# Patient Record
Sex: Male | Born: 1990
Health system: Southern US, Community
[De-identification: ages and names within clinical notes are randomized; demographics above are authoritative.]

## PROBLEM LIST (undated history)

## (undated) ENCOUNTER — Emergency Department (HOSPITAL_COMMUNITY): Admission: EM | Discharge status: Against Medical Advice | Payer: Self-pay

## (undated) DIAGNOSIS — I10 Essential (primary) hypertension: Secondary | ICD-10-CM

## (undated) DIAGNOSIS — K219 Gastro-esophageal reflux disease without esophagitis: Secondary | ICD-10-CM

## (undated) HISTORY — PX: OTHER SURGICAL HISTORY: SHX169

---

## 2010-05-27 ENCOUNTER — Emergency Department (HOSPITAL_COMMUNITY)
Admission: EM | Admit: 2010-05-27 | Discharge: 2010-05-27 | Disposition: A | Discharge status: Home / Self Care | Payer: Self-pay | Attending: Emergency Medicine | Admitting: Emergency Medicine

## 2010-05-27 DIAGNOSIS — L299 Pruritus, unspecified: Secondary | ICD-10-CM | POA: Insufficient documentation

## 2010-05-27 DIAGNOSIS — R109 Unspecified abdominal pain: Secondary | ICD-10-CM | POA: Insufficient documentation

## 2010-05-27 DIAGNOSIS — B356 Tinea cruris: Secondary | ICD-10-CM | POA: Insufficient documentation

## 2011-02-10 ENCOUNTER — Emergency Department (HOSPITAL_COMMUNITY)
Admission: EM | Admit: 2011-02-10 | Discharge: 2011-02-10 | Disposition: A | Discharge status: Home / Self Care | Payer: Self-pay | Attending: Emergency Medicine | Admitting: Emergency Medicine

## 2011-02-10 DIAGNOSIS — Z0389 Encounter for observation for other suspected diseases and conditions ruled out: Secondary | ICD-10-CM | POA: Insufficient documentation

## 2011-08-18 ENCOUNTER — Emergency Department (HOSPITAL_COMMUNITY)
Admission: EM | Admit: 2011-08-18 | Discharge: 2011-08-18 | Disposition: A | Discharge status: Hospice | Payer: Self-pay | Attending: Emergency Medicine | Admitting: Emergency Medicine

## 2011-08-18 ENCOUNTER — Encounter (HOSPITAL_COMMUNITY): Payer: Self-pay | Admitting: *Deleted

## 2011-08-18 DIAGNOSIS — M545 Low back pain, unspecified: Secondary | ICD-10-CM

## 2011-08-18 LAB — URINALYSIS, ROUTINE W REFLEX MICROSCOPIC
Glucose, UA: NEGATIVE mg/dL
Leukocytes, UA: NEGATIVE
Nitrite: NEGATIVE
Specific Gravity, Urine: 1.016 (ref 1.005–1.030)
pH: 7 (ref 5.0–8.0)

## 2011-08-18 MED ORDER — DIAZEPAM 5 MG PO TABS: 5.0000 mg | ORAL_TABLET | Freq: Two times a day (BID) | ORAL | Status: AC

## 2011-08-18 NOTE — ED Provider Notes (Signed)
History     CSN: [id number]  Arrival date & time 08/18/11  7829   First MD Initiated Contact with Patient 08/18/11 1037      Chief Complaint  Patient presents with  . Flank Pain    (Consider location/radiation/quality/duration/timing/severity/associated sxs/prior treatment) HPI Comments: Patient reports that he has had intermittent right sided lower back pain for the past couple of months.  He describes the pain as a tightness.  The pain does not radiate.  He reports that the pain is worse with movement of the back.  He boxes for a living.  He has not tried taking anything for his symptoms.  Patient is a 21 y.o. male presenting with back pain. The history is provided by the patient.  Back Pain  This is a recurrent problem. The pain is associated with no known injury. The symptoms are aggravated by bending and twisting. Pertinent negatives include no chest pain, no fever, no numbness, no bowel incontinence, no perianal numbness, no bladder incontinence, no dysuria, no paresthesias, no paresis, no tingling and no weakness. He has tried nothing for the symptoms.    History reviewed. No pertinent past medical history.  History reviewed. No pertinent past surgical history.  History reviewed. No pertinent family history.  History  Substance Use Topics  . Smoking status: Never Smoker   . Smokeless tobacco: Not on file  . Alcohol Use: Yes      Review of Systems  Constitutional: Negative for fever.  HENT: Negative for neck stiffness.   Respiratory: Negative for shortness of breath.   Cardiovascular: Negative for chest pain.  Gastrointestinal: Negative for bowel incontinence.  Genitourinary: Negative for bladder incontinence, dysuria, hematuria, discharge, difficulty urinating, penile pain and testicular pain.  Musculoskeletal: Positive for back pain. Negative for gait problem.  Neurological: Negative for tingling, weakness, numbness and paresthesias.    Allergies   Ibuprofen  Home Medications   Current Outpatient Rx  Name Route Sig Dispense Refill  . DIAZEPAM 5 MG PO TABS Oral Take 1 tablet (5 mg total) by mouth 2 (two) times daily. 10 tablet 0    BP 127/97  Pulse 76  Temp 98 F (36.7 C)  Resp 18  SpO2 99%  Physical Exam  Nursing note and vitals reviewed. Constitutional: He appears well-developed and well-nourished. No distress.  HENT:  Head: Normocephalic and atraumatic.  Mouth/Throat: Oropharynx is clear and moist.  Neck: Normal range of motion. Neck supple.  Cardiovascular: Normal rate, regular rhythm and normal heart sounds.   Pulmonary/Chest: Effort normal and breath sounds normal.  Abdominal: Soft. Normal appearance and bowel sounds are normal. He exhibits no mass. There is no tenderness. There is no rigidity, no guarding and no CVA tenderness.  Musculoskeletal:       Cervical back: He exhibits no tenderness, no bony tenderness, no swelling, no edema and no deformity.       Thoracic back: He exhibits normal range of motion, no tenderness, no bony tenderness, no swelling, no edema and no deformity.       Lumbar back: He exhibits normal range of motion, no tenderness, no bony tenderness, no swelling, no edema and no deformity.       Increased pain with twisting and lateral bending of the back.  Neurological: He is alert.  Skin: Skin is warm and dry. He is not diaphoretic. No erythema.  Psychiatric: He has a normal mood and affect.    ED Course  Procedures (including critical care time)   Labs Reviewed  URINALYSIS, ROUTINE W REFLEX MICROSCOPIC   No results found.   1. Lower back pain       MDM  Patient with back pain.  No neurological deficits and normal neuro exam.  Patient able to ambulate.  Pain worse with movement and patient boxes for a living.  Suspect that pain is musculoskeletal.   No loss of bowel or bladder control.  No concern for cauda equina.  No fever, night sweats, weight loss, h/o cancer, IVDU.  RICE  protocol and pain medicine indicated and discussed with patient.  UA negative.  Therefore, feel that kidney stone is unlikely.          Pascal Lux [place], PA-C 08/18/11 1739

## 2011-08-18 NOTE — Discharge Instructions (Signed)
Back Exercises   Back exercises help treat and prevent back injuries. The goal of back exercises is to increase the strength of your abdominal and back muscles and the flexibility of your back. These exercises should be started when you no longer have back pain. Back exercises include:   Pelvic Tilt. Lie on your back with your knees bent. Tilt your pelvis until the lower part of your back is against the floor. Hold this position 5 to 10 sec and repeat 5 to 10 times.   Knee to Chest. Pull first 1 knee up against your chest and hold for 20 to 30 seconds, repeat this with the other knee, and then both knees. This may be done with the other leg straight or bent, whichever feels better.   Sit-Ups or Curl-Ups. Bend your knees 90 degrees. Start with tilting your pelvis, and do a partial, slow sit-up, lifting your trunk only 30 to 45 degrees off the floor. Take at least 2 to 3 seconds for each sit-up. Do not do sit-ups with your knees out straight. If partial sit-ups are difficult, simply do the above but with only tightening your abdominal muscles and holding it as directed.   Hip-Lift. Lie on your back with your knees flexed 90 degrees. Push down with your feet and shoulders as you raise your hips a couple inches off the floor; hold for 10 seconds, repeat 5 to 10 times.   Back arches. Lie on your stomach, propping yourself up on bent elbows. Slowly press on your hands, causing an arch in your low back. Repeat 3 to 5 times. Any initial stiffness and discomfort should lessen with repetition over time.   Shoulder-Lifts. Lie face down with arms beside your body. Keep hips and torso pressed to floor as you slowly lift your head and shoulders off the floor.   Do not overdo your exercises, especially in the beginning. Exercises may cause you some mild back discomfort which lasts for a few minutes; however, if the pain is more severe, or lasts for more than 15 minutes, do not continue exercises until you see your caregiver.  Improvement with exercise therapy for back problems is slow.   See your caregivers for assistance with developing a proper back exercise program.   Document Released: 05/19/2004 Document Revised: 03/31/2011 Document Reviewed: 04/11/2005   ExitCare® Patient Information ©2012 [employer].     Back Pain, Adult   Low back pain is very common. About 1 in 5 people have back pain. The cause of low back pain is rarely dangerous. The pain often gets better over time. About half of people with a sudden onset of back pain feel better in just 2 weeks. About 8 in 10 people feel better by 6 weeks.   CAUSES   Some common causes of back pain include:   Strain of the muscles or ligaments supporting the spine.   Wear and tear (degeneration) of the spinal discs.   Arthritis.   Direct injury to the back.   DIAGNOSIS   Most of the time, the direct cause of low back pain is not known. However, back pain can be treated effectively even when the exact cause of the pain is unknown. Answering your caregiver's questions about your overall health and symptoms is one of the most accurate ways to make sure the cause of your pain is not dangerous. If your caregiver needs more information, he or she may order lab work or imaging tests (X-rays or MRIs). However, even   if imaging tests show changes in your back, this usually does not require surgery.   HOME CARE INSTRUCTIONS   For many people, back pain returns. Since low back pain is rarely dangerous, it is often a condition that people can learn to manage on their own.   Remain active. It is stressful on the back to sit or stand in one place. Do not sit, drive, or stand in one place for more than 30 minutes at a time. Take short walks on level surfaces as soon as pain allows. Try to increase the length of time you walk each day.   Do not stay in bed. Resting more than 1 or 2 days can delay your recovery.   Do not avoid exercise or work. Your body is made to move. It is not dangerous to be active,  even though your back may hurt. Your back will likely heal faster if you return to being active before your pain is gone.   Pay attention to your body when you bend and lift. Many people have less discomfort when lifting if they bend their knees, keep the load close to their bodies, and avoid twisting. Often, the most comfortable positions are those that put less stress on your recovering back.   Find a comfortable position to sleep. Use a firm mattress and lie on your side with your knees slightly bent. If you lie on your back, put a pillow under your knees.   Only take over-the-counter or prescription medicines as directed by your caregiver. Over-the-counter medicines to reduce pain and inflammation are often the most helpful. Your caregiver may prescribe muscle relaxant drugs. These medicines help dull your pain so you can more quickly return to your normal activities and healthy exercise.   Put ice on the injured area.   Put ice in a plastic bag.   Place a towel between your skin and the bag.   Leave the ice on for 15 to 20 minutes, 3 to 4 times a day for the first 2 to 3 days. After that, ice and heat may be alternated to reduce pain and spasms.   Ask your caregiver about trying back exercises and gentle massage. This may be of some benefit.   Avoid feeling anxious or stressed. Stress increases muscle tension and can worsen back pain. It is important to recognize when you are anxious or stressed and learn ways to manage it. Exercise is a great option.   SEEK MEDICAL CARE IF:   You have pain that is not relieved with rest or medicine.   You have pain that does not improve in 1 week.   You have new symptoms.   You are generally not feeling well.   SEEK IMMEDIATE MEDICAL CARE IF:   You have pain that radiates from your back into your legs.   You develop new bowel or bladder control problems.   You have unusual weakness or numbness in your arms or legs.   You develop nausea or vomiting.   You develop abdominal  pain.   You feel faint.   Document Released: 04/11/2005 Document Revised: 03/31/2011 Document Reviewed: 08/30/2010   ExitCare® Patient Information ©2012 [employer].

## 2011-08-18 NOTE — ED Notes (Signed)
Pt reports (L) flank pain x several months.  Denies urinary symptoms.  LBM-yesterday-normal.

## 2011-08-20 NOTE — ED Provider Notes (Signed)
Evaluation and management procedures were performed by the PA/NP/resident physician under my supervision/collaboration.   Joelynn Dust D Gerrett Loman, MD 08/20/11 2054 

## 2013-09-03 ENCOUNTER — Encounter (HOSPITAL_COMMUNITY): Payer: Self-pay | Admitting: Emergency Medicine

## 2013-09-03 ENCOUNTER — Emergency Department (INDEPENDENT_AMBULATORY_CARE_PROVIDER_SITE_OTHER): Payer: Self-pay

## 2013-09-03 ENCOUNTER — Emergency Department (INDEPENDENT_AMBULATORY_CARE_PROVIDER_SITE_OTHER)
Admission: EM | Admit: 2013-09-03 | Discharge: 2013-09-03 | Disposition: A | Discharge status: Home / Self Care | Payer: Self-pay | Source: Home / Self Care | Attending: Emergency Medicine | Admitting: Emergency Medicine

## 2013-09-03 DIAGNOSIS — S43429A Sprain of unspecified rotator cuff capsule, initial encounter: Secondary | ICD-10-CM

## 2013-09-03 DIAGNOSIS — S46019A Strain of muscle(s) and tendon(s) of the rotator cuff of unspecified shoulder, initial encounter: Secondary | ICD-10-CM

## 2013-09-03 MED ORDER — MELOXICAM 15 MG PO TABS: 15.0000 mg | ORAL_TABLET | Freq: Every day | ORAL | Status: DC

## 2013-09-03 NOTE — ED Provider Notes (Signed)
  Chief Complaint   Chief Complaint  Patient presents with  . 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 He was throwing a punch and felt something pull in his posterior shoulder. Ever since then he's had pain which is most evident in a certain boxing blows or if he externally rotates the arm. He is able to abduct fairly well and flexion doesn't cause him any problems. He denies any numbness or tingling down the arm, muscle weakness, or swelling of the arm.  Review of Systems   Other than as noted above, the patient denies any of the following symptoms: Systemic:  No fevers or chills. Musculoskeletal:  No joint pain, arthritis, swelling, back pain, or neck pain. No history of arthritis.  Neurological:  No muscular weakness or paresthesia.  PMFSH   Past medical history, family history, social history, meds, and allergies were reviewed.    Physical Examination     Vital signs:  BP 112/80  Pulse 72  Temp(Src) 98.7 F (37.1 C) (Oral)  Resp 10  SpO2 100% Gen:  Alert and oriented times 3.  In no distress. Musculoskeletal: There is no swelling and almost no pain to palpation. He does complain of a little bit of pain with palpation over the supraspinatus. Neer test was negative.  Hawkins test was positive.  Empty cans test was positive. Otherwise, all joints had a full a ROM with no swelling, bruising or deformity.  No edema, pulses full. Extremities were warm and pink.  Capillary refill was brisk.  Skin:  Clear, warm and dry.  No rash. Neuro:  Alert and oriented times 3.  Muscle strength was normal.  Sensation was intact to light touch.   Radiology   Dg Shoulder Right  09/03/2013   CLINICAL DATA:  Right shoulder pain, boxing injury  EXAM: RIGHT SHOULDER - 2+ VIEW  COMPARISON:  None.  FINDINGS: No fracture or dislocation is seen.  The joint spaces are preserved.  The  visualized soft tissues are unremarkable.  Visualized right lung is clear.  IMPRESSION: No fracture or dislocation is seen.   Electronically Signed   By: Charline BillsSriyesh  Krishnan M.D.   On: 09/03/2013 19:40   I reviewed the images independently and personally and concur with the radiologist's findings.  Assessment   The encounter diagnosis was Rotator cuff strain.  No evidence of rotator cuff tear.  Plan     1.  Meds:  The following meds were prescribed:   Discharge Medication List as of 09/03/2013  7:54 PM    START taking these medications   Details  meloxicam (MOBIC) 15 MG tablet Take 1 tablet (15 mg total) by mouth daily., Starting 09/03/2013, Until Discontinued, Normal        2.  Patient Education/Counseling:  The patient was given appropriate handouts, self care instructions, and instructed in symptomatic relief.  Given shoulder rehabilitation exercises, suggest he followup with sports medicine.  3.  Follow up:  The patient was told to follow up here if no better in 3 to 4 days, or sooner if becoming worse in any way, and given some red flag symptoms such as worsening pain or new neurological symptoms which would prompt immediate return.       Thomas Likesavid C Aleksandr Pellow, MD 09/03/13 2156

## 2013-09-03 NOTE — ED Notes (Signed)
Pain right shoulder w limited ROM same. No known injury

## 2013-09-03 NOTE — Discharge Instructions (Signed)

## 2013-10-14 ENCOUNTER — Ambulatory Visit (INDEPENDENT_AMBULATORY_CARE_PROVIDER_SITE_OTHER): Payer: Self-pay | Admitting: Family Medicine

## 2013-10-14 ENCOUNTER — Encounter: Payer: Self-pay | Admitting: Family Medicine

## 2013-10-14 VITALS — BP 118/83 | Ht 63.0 in | Wt 130.0 lb

## 2013-10-14 DIAGNOSIS — M67919 Unspecified disorder of synovium and tendon, unspecified shoulder: Secondary | ICD-10-CM

## 2013-10-14 DIAGNOSIS — M75101 Unspecified rotator cuff tear or rupture of right shoulder, not specified as traumatic: Secondary | ICD-10-CM

## 2013-10-14 DIAGNOSIS — M719 Bursopathy, unspecified: Secondary | ICD-10-CM

## 2013-10-14 MED ORDER — METHYLPREDNISOLONE ACETATE 40 MG/ML IJ SUSP
40.0000 mg | Freq: Once | INTRAMUSCULAR | Status: AC
  Administered 2013-10-14: 40 mg via INTRA_ARTICULAR

## 2013-10-14 NOTE — Patient Instructions (Signed)
We suspect subacromial bursitis. If it is not better in 4 weeks, please return to see us.

## 2013-10-16 DIAGNOSIS — M67921 Unspecified disorder of synovium and tendon, right upper arm: Secondary | ICD-10-CM | POA: Insufficient documentation

## 2013-10-16 NOTE — Assessment & Plan Note (Signed)
That he has primarily a supraspinatus imbalance with his internal/external rotators. We placed him on home exercise program for internal/external rotation only. We did give him a corticosteroid injection today and precautions to do no heavy lifting or heavy workouts for 24-48 hours after. Followup 4-6 weeks the

## 2013-10-16 NOTE — Progress Notes (Signed)
Patient ID: Thomas Moss, male   DOB: 02-Oct-1990, 23 y.o.   MRN: [id number]  Thomas Moss - 23 y.o. male MRN [id number]  Date of birth: 02-Oct-1990    SUBJECTIVE:     Right shoulder pain that started about 45 weeks ago. See us in onset. He is training as a Writerboxer. He had some pain before his last bout and then had increased pain afterwards but no specific injury. He is continued training through the pain. Pain is aching in nature, anterior and deep in the shoulder joint. No radiation to the hand. No weakness. ROS:     No fever, sweats, chills, unusual weight change.  PERTINENT  PMH / PSH FH / / SH:  Past Medical, Surgical, Social, and Family History Reviewed & Updated per EMR.  Pertinent Historical Findings include:  No prior history of right shoulder injury or surgery  OBJECTIVE: BP 118/83  Ht 5\' 3"  (1.6 m)  Wt 130 lb (58.968 kg)  BMI 23.03 kg/m2  Physical Exam:  Vital signs are reviewed. GENERAL: Well-developed male no acute distress Shoulder: Right. Full range of motion. Some pain with supraspinatus testing but strength is intact. Rotator cuff strength is intact in all planes. Biceps tendon is nontender.  INJECTION: Patient was given informed consent, signed copy in the chart. Appropriate time out was taken. Area prepped and draped in usual sterile fashion. 1 cc of methylprednisolone 40 mg/ml plus  4 cc of 1% lidocaine without epinephrine was injected into the right subacromial bursa using a(n) posterior approach. The patient tolerated the procedure well. There were no complications. Post procedure instructions were given.   ASSESSMENT & PLAN:  See problem based charting & AVS for pt instructions. 2

## 2013-10-28 ENCOUNTER — Ambulatory Visit: Payer: Self-pay

## 2013-11-08 ENCOUNTER — Ambulatory Visit: Payer: Self-pay | Admitting: Family Medicine

## 2013-12-12 ENCOUNTER — Ambulatory Visit (INDEPENDENT_AMBULATORY_CARE_PROVIDER_SITE_OTHER): Payer: Self-pay | Admitting: Sports Medicine

## 2013-12-12 ENCOUNTER — Encounter: Payer: Self-pay | Admitting: Sports Medicine

## 2013-12-12 VITALS — BP 123/69 | HR 71 | Ht 63.0 in | Wt 135.0 lb

## 2013-12-12 DIAGNOSIS — M6790 Unspecified disorder of synovium and tendon, unspecified site: Secondary | ICD-10-CM

## 2013-12-12 DIAGNOSIS — M719 Bursopathy, unspecified: Secondary | ICD-10-CM

## 2013-12-12 DIAGNOSIS — M67921 Unspecified disorder of synovium and tendon, right upper arm: Secondary | ICD-10-CM

## 2013-12-12 MED ORDER — NITROGLYCERIN 0.2 MG/HR TD PT24: 0.2000 mg | MEDICATED_PATCH | Freq: Every day | TRANSDERMAL | Status: DC

## 2013-12-12 NOTE — Patient Instructions (Signed)
Lawnmower, Robbery exercises, shoulder rolls. & Biceps curls, and SUPINATION Work your way up to 3 sets of 15. Increasing weight.  Nitroglycerin Protocol  Apply 1/4 nitroglycerin patch to affected area daily.  Change position of patch within the affected area every 24 hours.  You may experience a headache during the first 1-2 weeks of using the patch, these should subside.  If you experience headaches after beginning nitroglycerin patch treatment, you may take your preferred over the counter pain reliever.  Another side effect of the nitroglycerin patch is skin irritation or rash related to patch adhesive.  Please notify our office if you develop more severe headaches or rash, and stop the patch.  Tendon healing with nitroglycerin patch may require 12 to 24 weeks depending on the extent of injury.  Men should not use if taking Viagra, Cialis, or Levitra.   Do not use if you have migraines or rosacea.

## 2013-12-12 NOTE — Progress Notes (Signed)
  Thomas Moss - 23 y.o. male MRN [id number]  Date of birth: 22-Jan-1991  SUBJECTIVE:  Including CC & ROS.  The patient is following up for evaluation of: Right Shoulder Pain: Persistent anterior shoulder pain; 50% improved following injection but seems to be worsening.  Left shoulder is beginning to have some  similar symptoms but only minimally at this time.  Pain with any upward motion.  Significant activity modification including essentially stopping lifting weight; doing some pushups and pullups; worse pain with pullups.  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   HISTORY: Past Medical, Surgical, Social, and Family History Reviewed & Updated per EMR. Pertinent Historical Findings include: Underwent CSI 10/14/2013.  Otherwise healthy  DATA REVIEWED:  X-ray 09/03/13: Well maintained joint space; overall normal shoulder  PHYSICAL EXAM:  VS: BP:123/69 mmHg  HR:71bpm  TEMP: ( )  RESP:   HT:5\' 3"  (160 cm)   WT:135 lb (61.236 kg)  BMI:24 PHYSICAL EXAM: GENERAL:  Adult well built [place]  male. In no discomfort; no respiratory distress  PSYCH:  alert and appropriate, good insight  Shoulder Exam:        Gen/Palp:  Bilaterally protracted shoulders with significant, symmetric muscle hypertrophy.  Significant scapular dyskinesia  From 60-120 degrees on right; less on left      ROM/MST:  Limitation in end range shoulder flexion; internal rotation on R to t10; left to t6.   Internal/external rotation strength 5/5 but right slightly weaker than left in internal rotation                      NV:  Sensation intact, myotomal strength 5+/5. Negative spurlings. Radial pulses 2+/4                 Tests:  Negative empty can, hawkins, yergason's, obriens.  Positive Speeds and cross arm test. Limited MSK Ultrasound Exam Of Shoulder:           Findings:   Large Hypoechoic change around long head of biceps with apparent partial tendon disruption.  Minimal supraspinatus and  subscapularis hypoechoic change.  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

## 2013-12-13 NOTE — Assessment & Plan Note (Addendum)
Acute on chronic condition  - Exam and clinic symptoms consistent with Biceps.  No significant evidence of impingement. 1. Nitroglycerin protocol 2. Strengthening exercises (for both shoulder as suspect early development on left) per AVS/ periscap mm > Return in 4 weeks and plan on reultrasound; ensure left arm symptoms improving with conservative care.

## 2014-01-09 ENCOUNTER — Ambulatory Visit: Payer: Self-pay | Admitting: Sports Medicine

## 2015-09-04 ENCOUNTER — Encounter: Payer: Self-pay | Admitting: Family Medicine

## 2015-09-04 ENCOUNTER — Ambulatory Visit (INDEPENDENT_AMBULATORY_CARE_PROVIDER_SITE_OTHER): Payer: Self-pay | Admitting: Family Medicine

## 2015-09-04 VITALS — BP 121/75 | Ht 63.0 in | Wt 132.0 lb

## 2015-09-04 DIAGNOSIS — M7522 Bicipital tendinitis, left shoulder: Secondary | ICD-10-CM

## 2015-09-04 MED ORDER — NITROGLYCERIN 0.2 MG/HR TD PT24: MEDICATED_PATCH | TRANSDERMAL | Status: DC

## 2015-09-04 NOTE — Patient Instructions (Signed)
Mild bicep tendinitis Decrease strength of jabs tp 75% for next 3 weeks DAILY do 30 bicep curls with low weigjht---NOT HEAVY weight AFTER your workout, place 1/4 of nitroglycerin patch on bicep area and wear it until 1 1/2 hour befpre next work out--take it off then and leave off until you put a new one on AFTER your workout. See me in 3-4 weeks  You may experience a headache during the first 1-2 days and maybe up to 2 weeks of using the patch; these should improve and go away. If you experience headaches after beginning nitroglycerin patch treatment, you may take your preferred over the counter pain medicine such as tylenol or advil as directed on the box. Another side effect of the nitroglycerin patch can be skin irritation  or rash related to the patch adhesive.   Please notify our office if you develop  severe headaches or rash, and stop the patch.  Please call our office with any questions or problems. Tendon healing with nitroglycerin patch may require 12 to 24 weeks depending on the extent of injury. Men should not use while  taking Viagra, Cialis, or Levitra as it may cause an unsafe lowering of the blood pressure.. Take off at least an hour before use of any of these meds and replace after intercourse. Use with caution if you have migraines or rosacea.

## 2015-09-07 ENCOUNTER — Other Ambulatory Visit: Payer: Self-pay | Admitting: *Deleted

## 2015-09-07 DIAGNOSIS — M7522 Bicipital tendinitis, left shoulder: Secondary | ICD-10-CM | POA: Insufficient documentation

## 2015-09-07 DIAGNOSIS — M7521 Bicipital tendinitis, right shoulder: Secondary | ICD-10-CM | POA: Insufficient documentation

## 2015-09-07 MED ORDER — NITROGLYCERIN 0.2 MG/HR TD PT24: MEDICATED_PATCH | TRANSDERMAL | Status: DC

## 2015-09-07 MED FILL — NITROGLYCERIN 0.2 MG/HR PTC: 0.2 | 28 days supply | Qty: 4 | Fill #0
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
## 2015-09-07 NOTE — Progress Notes (Signed)
Patient ID: Thomas Moss, male   DOB: 05-11-1990, 25 y.o.   MRN: [id number]  Thomas Moss - [age] y.o. 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 This is causing him to alter his training and he is not able to fight as his maximum capacity. He's had no specific injury. He does not think he significantly changed his training regimen.Pain is present during certain activities and occasionally at night but not present all day ROS:     No unusual weight change, fever, sweats, chills. He's noted no numbness or tingling in his upper extremities.  PERTINENT  PMH / PSH FH / / SH:  Past Medical, Surgical, Social, and Family History Reviewed & Updated in the EMR.  Pertinent findings include:  Nonsmoker, no personal history diabetes mellitus. No prior history of specific shoulder injury. No history of left shoulder surgery. He has had some rotator cuff syndrome problems of the right shoulder back in 2015.  OBJECTIVE: BP 121/75 mmHg  Ht 5\' 3"  (1.6 m)  Wt 132 lb (59.875 kg)  BMI 23.39 kg/m2  Physical Exam:  Vital signs are reviewed. GEN.: Well-developed male no acute distress SHOULDERS: Bilaterally has full range of motion and intact strength in all planes the rotator cuff. Mild tenderness to palpation of the left biceps tendon rock similarly. Intact biceps strength. Is no defect noted in the bicep. Vascular: Radial pulses 2+ bilaterally equal. SKIN: Is no erythema, no increased warmth, no rash noted around the area of the left upper arm bicep area.  ULTRASOUND: He is a small amount of fluid in the bicep as it enters the bicipital groove. The tendon is intact.  ASSESSMENT & PLAN:  See problem based charting & AVS for pt instructions.

## 2015-10-09 ENCOUNTER — Encounter: Payer: Self-pay | Admitting: Family Medicine

## 2015-10-09 ENCOUNTER — Ambulatory Visit (INDEPENDENT_AMBULATORY_CARE_PROVIDER_SITE_OTHER): Payer: Self-pay | Admitting: Family Medicine

## 2015-10-09 VITALS — BP 116/74 | Ht 63.0 in | Wt 132.0 lb

## 2015-10-09 DIAGNOSIS — M67929 Unspecified disorder of synovium and tendon, unspecified upper arm: Secondary | ICD-10-CM

## 2015-10-09 MED ORDER — NITROGLYCERIN 0.2 MG/HR TD PT24: MEDICATED_PATCH | TRANSDERMAL | Status: DC

## 2015-10-09 MED FILL — NITROGLYCERIN 0.2 MG/HR PTC: 0.2 | 28 days supply | Qty: 4 | Fill #1

## 2015-10-09 NOTE — Progress Notes (Signed)
Patient ID: Thomas Moss, male   DOB: Apr 20, 1991, 25 y.o.   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  Pt states that since being seen last month L shoulder pain has stayed about the same.  Pain is in front of L shoulder (points to biceps tendon), worse with training/certain motions, usually not painful at rest.  Has primarily been using nitroglycerin patches, however has used whole patch (was supposed to cut into fourths) and intermittently 2/2 headaches (was supposed to use continuously).  Has not been taking other medications.  Still weight lifting and training, has fight July 1st and wants advise for pain management.  Very similar but more mild pain has appeared in the R shoulder since last visit, not treating it.    SHx: boxer, next fight 10/24/2015  ROS: Pertinent review of systems: negative for fever or unusual weight change.    PE: BP 116/74 mmHg  Ht 5\' 3"  (1.6 m)  Wt 132 lb (59.875 kg)  BMI 23.39 kg/m2 Gen: 25 y.o. yo male sitting one exam table in NAD HEENT: normocephalic, atraumatic Pulm: normal work of breathing MSK:  RUE: inspection reveals no abnormalities, - TTP throughout, full active ROM, strength 5/5 throughout, sensation grossly intact, - empty can, cross arm test, non-painful arch LUE: inspection reveals no abnormalities, + TTP over biceps tendon at bicipital groove, full active ROM, strength and sensation intact throughout, - empty can, cross arm test, non-painful arch   A/P: Thomas Moss is a 25 y.o. yo male with biceps tendinitis not improving.  L biceps tendinitis Pain not well controlled, was not using nitroglycerin patches correctly. -- Advised to refill nitroglycerin patches today -- Use 1/4 patch continuously -- Counseled that may have headache first 48 hours, but best thing to do is persist and it will eventually go  away -- Advised to not use patch day leading up to fight -- Still can do full training and fight 7/1 -- F/u after fight to further eval pain  Follow-up: 2 weeks (after 7/1)  Thomas Moss, MS4

## 2015-10-30 ENCOUNTER — Ambulatory Visit: Payer: Self-pay | Admitting: Family Medicine

## 2015-11-19 MED FILL — NITROGLYCERIN 0.2 MG/HR PTC: 0.2 | 28 days supply | Qty: 4 | Fill #2

## 2016-06-20 MED FILL — NITROGLYCERIN 0.2 MG/HR PTC: 0.2 | 28 days supply | Qty: 4 | Fill #3

## 2016-10-07 ENCOUNTER — Ambulatory Visit: Payer: Self-pay | Admitting: Family Medicine

## 2017-03-13 ENCOUNTER — Other Ambulatory Visit: Payer: Self-pay | Admitting: *Deleted

## 2017-03-13 MED ORDER — NITROGLYCERIN 0.2 MG/HR TD PT24
MEDICATED_PATCH | TRANSDERMAL | 0 refills | Status: DC
Start: 2017-03-13 — End: 2017-07-07

## 2017-03-28 MED FILL — NITROGLYCERIN 0.2 MG/HR PTC: 0.2 | 30 days supply | Qty: 8 | Fill #0

## 2017-05-29 ENCOUNTER — Ambulatory Visit: Payer: Self-pay

## 2017-07-07 ENCOUNTER — Ambulatory Visit (INDEPENDENT_AMBULATORY_CARE_PROVIDER_SITE_OTHER): Payer: Self-pay | Admitting: Family Medicine

## 2017-07-07 ENCOUNTER — Encounter: Payer: Self-pay | Admitting: Family Medicine

## 2017-07-07 DIAGNOSIS — S46811A Strain of other muscles, fascia and tendons at shoulder and upper arm level, right arm, initial encounter: Secondary | ICD-10-CM

## 2017-07-07 MED ORDER — NITROGLYCERIN 0.2 MG/HR TD PT24: MEDICATED_PATCH | TRANSDERMAL | 1 refills | Status: DC

## 2017-07-07 MED FILL — NITROGLYCERIN 0.2 MG/HR PTC: 0.2 | 30 days supply | Qty: 8 | Fill #0

## 2017-07-07 NOTE — Progress Notes (Signed)
HPI  CC: Right shoulder pain Patient is here with complaints of right-sided shoulder pain.  He states that pain began approximately 2 weeks ago.  Patient is a Patent examiner.  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  He denies any feelings of subluxation or dislocation.  Pain lessened over time but has persisted over the past 2 weeks.  He experiences most of his discomfort with "crosses".  No weakness, numbness, or paresthesias.  No swelling or bruising.  Pain is described as being deep, anterior, and somewhat posterior.  Medications/Interventions Tried: Ibuprofen  See HPI and/or previous note for associated ROS.  Objective: BP 120/86   Ht 5\' 3"  (1.6 m)   Wt 128 lb (58.1 kg)   BMI 22.67 kg/m  Gen: Right-Hand Dominant. NAD, well groomed, a/o x3, normal affect.  CV: Well-perfused. Warm.  Resp: Non-labored.  Neuro: Sensation intact throughout. No gross coordination deficits.  Gait: Nonpathologic posture, unremarkable stride without signs of limp or balance issues. Shoulder, Right: TTP noted at the bicipital groove and deep anterior shoulder. No evidence of bony deformity, asymmetry, or muscle atrophy; No TTP at [facility] joint. Full active and passive range of motion (180 flex Elisabeth Most /150Abd /90ER /70IR). Strength significantly reduced (3/5) in IR, otherwise 5/5 throughout. No abnormal scapular function observed. Sensation intact. Peripheral pulses intact.  Special Tests:   - Crossarm test: NEG   - Empty can: Mildly positive   - Hawkins: NEG   - Obrien's test: NEG   - Yergason's: NEG   - Speeds test: Positive  ULTRASOUND: Shoulder, right Diagnostic limited ultrasound imaging obtained of patient's right shoulder.  - No obvious evidence of bony deformity or osteophyte development appreciated.  - Long head of the biceps tendon: No evidence of tendon thickening, calcification, subluxation, or tearing in short  or long axis views. POSITIVE fluid presence with bullseye sign.  - Subscapularis tendon: complete visualization across the width of the insertion point yielded evidence of tendon rupture and fluid presence with enough tendinous involvement to be concern for full-thickness tear.  - Supraspinatus tendon: complete visualization across the width of the insertion point yielded no evidence of tendon thickening, calcification, or tears in the long axis view. No evidence of bursal inflammation appreciated.  - Infraspinatus and teres minor tendons: visualization across the width of the insertion points yielded no evidence of tendon thickening, calcification, or tears in the long axis view.  IMPRESSION: findings consistent with - Large partial, possible full-thickness subscapularis tendon tear.   - Bicipital tendinitis of the long head.   Assessment and plan:  Full thickness tear of right subscapularis tendon Patient is here with signs and symptoms concerning for full-thickness tear of the subscapularis tendon.  I had a long discussion with patient regarding this injury as he is a Patent examiner.  I informed him that in the case that it is a full-thickness tear surgical intervention may be his most appropriate choice to return to "100%" considering his profession.  Conservative treatment is not unreasonable however this option is usually better suited for non-professional athletes. -Patient encouraged to "shut down" the right shoulder for the next 4-5 days -Home exercises with Thera-Band provided today.  Patient is to begin in 4-5 days. -Nitroglycerin patches prescribed today. -Patient is to follow-up in 2-3 weeks (1 week prior to his next fight) to go over his progress.  Next: Patient was informed that if he decides that he desires  surgical intervention he is to contact our office.  At that time we will either order an MRI and refer him or simply refer him to surgery.   Meds ordered this encounter    Medications  . DISCONTD: nitroGLYCERIN (NITRODUR - DOSED IN MG/24 HR) 0.2 mg/hr patch    Sig: Place 1/4 patch to the affected area daily.    Dispense:  30 patch    Refill:  1  . nitroGLYCERIN (NITRODUR - DOSED IN MG/24 HR) 0.2 mg/hr patch    Sig: Place 1/4 patch to the affected area daily.    Dispense:  30 patch    Refill:  1     Kathee DeltonIan D McKeag, MD,MS [facility] Sports Medicine Fellow 07/07/2017 12:25 PM

## 2017-07-07 NOTE — Assessment & Plan Note (Signed)
Patient is here with signs and symptoms concerning for full-thickness tear of the subscapularis tendon.  I had a long discussion with patient regarding this injury as he is a Patent examinerprofessional boxer.  I informed him that in the case that it is a full-thickness tear surgical intervention may be his most appropriate choice to return to "100%" considering his profession.  Conservative treatment is not unreasonable however this option is usually better suited for non-professional athletes. -Patient encouraged to "shut down" the right shoulder for the next 4-5 days -Home exercises with Thera-Band provided today.  Patient is to begin in 4-5 days. -Nitroglycerin patches prescribed today. -Patient is to follow-up in 2-3 weeks (1 week prior to his next fight) to go over his progress.  Next: Patient was informed that if he decides that he desires surgical intervention he is to contact our office.  At that time we will either order an MRI and refer him or simply refer him to surgery.

## 2017-07-07 NOTE — Progress Notes (Signed)
[facility]: Attending Note: I have reviewed the chart, discussed wit the Sports Medicine Fellow. I agree with assessment and treatment plan as detailed in the Fellow's note. He has full-thickness subscapularis muscle tear.  Unclear if he will be able to complete his upcoming April.  He is already signed.  And back in 2-3 weeks to assess his improvement we may have to disqualify him for the match.

## 2017-07-07 NOTE — Patient Instructions (Signed)

## 2017-07-28 ENCOUNTER — Ambulatory Visit: Payer: Self-pay | Admitting: Family Medicine

## 2017-10-12 ENCOUNTER — Telehealth: Payer: Self-pay

## 2017-10-12 DIAGNOSIS — S46811A Strain of other muscles, fascia and tendons at shoulder and upper arm level, right arm, initial encounter: Secondary | ICD-10-CM

## 2017-10-12 NOTE — Telephone Encounter (Signed)
Referral placed to [facility]. 

## 2017-10-23 ENCOUNTER — Ambulatory Visit (INDEPENDENT_AMBULATORY_CARE_PROVIDER_SITE_OTHER): Payer: Self-pay | Admitting: Orthopaedic Surgery

## 2017-10-28 ENCOUNTER — Other Ambulatory Visit: Payer: Self-pay

## 2017-10-28 ENCOUNTER — Emergency Department (HOSPITAL_COMMUNITY)
Admission: EM | Admit: 2017-10-28 | Discharge: 2017-10-28 | Disposition: A | Discharge status: Home / Self Care | Payer: Self-pay | Attending: Emergency Medicine | Admitting: Emergency Medicine

## 2017-10-28 DIAGNOSIS — F1010 Alcohol abuse, uncomplicated: Secondary | ICD-10-CM

## 2017-10-28 DIAGNOSIS — F1019 Alcohol abuse with unspecified alcohol-induced disorder: Secondary | ICD-10-CM | POA: Insufficient documentation

## 2017-10-28 DIAGNOSIS — Z79899 Other long term (current) drug therapy: Secondary | ICD-10-CM | POA: Insufficient documentation

## 2017-10-28 NOTE — Discharge Instructions (Signed)
You are medically cleared and may follow-up with [facility] as you have planned. I will fax my medical note from today's visit to [facility] today. Well wishes in your journey!

## 2017-10-28 NOTE — ED Triage Notes (Signed)
Pt states he is here for medical clearance for alcoholism

## 2017-10-28 NOTE — ED Notes (Signed)
PT DISCHARGED. INSTRUCTIONS GIVEN. AAOX4. PT IN NO APPARENT DISTRESS OR PAIN. THE OPPORTUNITY TO ASK QUESTIONS WAS PROVIDED. 

## 2017-10-28 NOTE — ED Provider Notes (Addendum)
[facility]-EMERGENCY DEPT Provider Note  CSN: [id number] Arrival date & time: 10/28/17  1107   History   Chief Complaint Chief Complaint  Patient presents with  . Alcohol Problem    HPI Thomas Moss is a 27 y.o. male with no significant medical history who presented to the ED for alcohol detox. Patient states he was sent here from [facility] for "medical clearance from alcohol." Patient states his last drink was yesterday 10/27/17 around 10pm. He admits to drinking 3-4 beers or shots of liquor about 3 days a week. He states that on the days he does not drink that he does not experience any withdrawal symptoms or have physical complaints. Today patient has no physical complaints. Denies headache, tremor, abdominal pain, N/V/D, AMS/confusion or auditory/visual/tactile hallucinations.  No past medical history on file.  Patient Active Problem List   Diagnosis Date Noted  . Full thickness tear of right subscapularis tendon 07/07/2017  . Biceps tendinitis on left 09/07/2015  . Tendinopathy of right biceps tendon 10/16/2013    No past surgical history on file.      Home Medications    Prior to Admission medications   Medication Sig Start Date End Date Taking? Authorizing Provider  cyclobenzaprine (FLEXERIL) 5 MG tablet Take 5 mg by mouth at bedtime.    [provider]  ibuprofen (ADVIL,MOTRIN) 400 MG tablet Take 800 mg by mouth every 8 (eight) hours as needed.     [provider]  nitroGLYCERIN (NITRODUR - DOSED IN MG/24 HR) 0.2 mg/hr patch Place 1/4 patch to the affected area daily. 07/07/17   Nestor Ramp, MD    Family History No family history on file.  Social History Social History   Tobacco Use  . Smoking status: Never Smoker  Substance Use Topics  . Alcohol use: Yes  . Drug use: No     Allergies   Patient has no known allergies.   Review of Systems Review of Systems  Constitutional: Negative.   Eyes: Negative.     Respiratory: Negative.   Cardiovascular: Negative.   Gastrointestinal: Negative.   Endocrine: Negative.   Musculoskeletal: Negative.   Skin: Negative.   Neurological: Negative.   Psychiatric/Behavioral: Negative.      Physical Exam Updated Vital Signs BP (!) 141/97 (BP Location: Left Arm)   Pulse 66   Temp 98.4 F (36.9 C) (Oral)   Resp 18   SpO2 100%   Physical Exam  Constitutional: He is oriented to person, place, and time. He appears well-developed and well-nourished. He is cooperative. No distress.  HENT:  Head: Normocephalic and atraumatic.  Mouth/Throat: Uvula is midline, oropharynx is clear and moist and mucous membranes are normal.  Eyes: Pupils are equal, round, and reactive to light. Conjunctivae, EOM and lids are normal. No scleral icterus.  Cardiovascular: Normal rate, regular rhythm and normal heart sounds.  Pulmonary/Chest: Effort normal and breath sounds normal.  Abdominal: Soft. Bowel sounds are normal. He exhibits no distension. There is no hepatomegaly. There is no tenderness.  Neurological: He is alert and oriented to person, place, and time. He has normal strength. No cranial nerve deficit or sensory deficit. He exhibits normal muscle tone. He displays a negative Romberg sign. Coordination and gait normal.  Reflex Scores:      Tricep reflexes are 2+ on the right side and 2+ on the left side.      Bicep reflexes are 2+ on the right side and 2+ on the left side.  Brachioradialis reflexes are 2+ on the right side and 2+ on the left side.      Patellar reflexes are 2+ on the right side and 2+ on the left side.      Achilles reflexes are 2+ on the right side and 2+ on the left side. Skin: Skin is warm and intact. Capillary refill takes less than 2 seconds. He is not diaphoretic. No pallor.  Psychiatric: He has a normal mood and affect. His speech is normal and behavior is normal. Thought content normal. Cognition and memory are normal.  Nursing note and  vitals reviewed.    ED Treatments / Results  Labs (all labs ordered are listed, but only abnormal results are displayed) Labs Reviewed - No data to display  EKG None  Radiology No results found.  Procedures Procedures (including critical care time)  Medications Ordered in ED Medications - No data to display   Initial Impression / Assessment and Plan / ED Course  Triage vital signs and the nursing notes have been reviewed.  Pertinent labs & imaging results that were available during care of the patient were reviewed and considered in medical decision making (see chart for details).   Patient presents for alcohol detox and was informed that this ED does not perform detox. Physical exam is normal and patient has no focal neuro deficits or other findings that would warrant imaging or lab work today. He is able to converse and ambulate without issue. Thought process and speech is logical, linear and goal directed. Denies SI, HI and AVH. He appears to have metabolized completely.  Given patient's reported alcohol intake and frequency, it is very unlikely that he would score high enough on CIWA (>8) to necessitate benzodiazepines for withdrawal symptom control. He also reports not having any withdrawal symptoms on the days he is free from alcohol which is also positive. Patient already has a plan for treatment and [facility]  Education provided on withdrawal s/s and reasons to return to the ED. [facility] patient request.   Final Clinical Impressions(s) / ED Diagnoses   Dispo: Home. After thorough clinical evaluation, this patient is determined to be medically stable and can be safely discharged with the previously mentioned treatment and/or outpatient follow-up/referral(s). At this time, there are no other apparent medical conditions that require further screening, evaluation or treatment.   Final  diagnoses:  Alcohol use disorder, mild, abuse    ED Discharge Orders    None      Reva BoresMortis, Gabrielle I, PA-C 10/28/17 [address]1250    Mortis, CalienteGabrielle I, PA-C 10/28/17 1255    Maia PlanLong, Joshua G, MD 10/28/17 2020

## 2017-11-13 ENCOUNTER — Telehealth (INDEPENDENT_AMBULATORY_CARE_PROVIDER_SITE_OTHER): Payer: Self-pay | Admitting: Orthopaedic Surgery

## 2017-11-13 NOTE — Telephone Encounter (Signed)
Returned call to patient left message to call back. 

## 2017-11-15 ENCOUNTER — Other Ambulatory Visit (INDEPENDENT_AMBULATORY_CARE_PROVIDER_SITE_OTHER): Payer: Self-pay

## 2017-11-15 ENCOUNTER — Ambulatory Visit (INDEPENDENT_AMBULATORY_CARE_PROVIDER_SITE_OTHER): Payer: Self-pay | Admitting: Orthopaedic Surgery

## 2017-11-15 ENCOUNTER — Encounter (INDEPENDENT_AMBULATORY_CARE_PROVIDER_SITE_OTHER): Payer: Self-pay | Admitting: Orthopaedic Surgery

## 2017-11-15 DIAGNOSIS — G8929 Other chronic pain: Secondary | ICD-10-CM

## 2017-11-15 DIAGNOSIS — M25511 Pain in right shoulder: Principal | ICD-10-CM

## 2017-11-15 MED ORDER — LIDOCAINE HCL 1 % IJ SOLN
3.0000 mL | INTRAMUSCULAR | Status: AC | PRN
  Administered 2017-11-15: 3 mL

## 2017-11-15 MED ORDER — METHYLPREDNISOLONE ACETATE 40 MG/ML IJ SUSP
40.0000 mg | INTRAMUSCULAR | Status: AC | PRN
  Administered 2017-11-15: 40 mg via INTRA_ARTICULAR

## 2017-11-15 NOTE — Progress Notes (Signed)
Office Visit Note   Patient: Thomas Moss           Date of Birth: 19-Oct-1990           MRN: [id number] Visit Date: 11/15/2017              Requested by: Lavinia SharpsPlacey, Mary Ann, NP [address] [place], [place] [id number] PCP: Lavinia SharpsPlacey, Mary Ann, NP   Assessment & Plan: Visit Diagnoses:  1. Chronic right shoulder pain     Plan: His clinical exam shows excellent strength of the right shoulder.  This can be due to the fact he is a very muscular individual and is compensating for this well.  I did provide a steroid injection subacromial space today and this will help with inflammation.  I would actually like to obtain an MRI of his right shoulder to further assess the extent of his rotator cuff tear and retraction potentially since he has been having an issue with his shoulder for many years now.  We want to be able to see  the extent of any type of tear and potential retraction as well as damage to other tissues so we can best determine we will see him back once we have the MRI of his right shoulder.  What surgery may be warranted.  Follow-Up Instructions: Return in about 2 weeks (around 11/29/2017).   Orders:  Orders Placed This Encounter  Procedures  . Large Joint Inj   No orders of the defined types were placed in this encounter.     Procedures: Large Joint Inj: R subacromial bursa on 11/15/2017 2:09 PM Indications: pain and diagnostic evaluation Details: 22 G 1.5 in needle  Arthrogram: No  Medications: 3 mL lidocaine 1 %; 40 mg methylPREDNISolone acetate 40 MG/ML Outcome: tolerated well, no immediate complications Procedure, treatment alternatives, risks and benefits explained, specific risks discussed. Consent was given by the patient. Immediately prior to procedure a time out was called to verify the correct patient, procedure, equipment, support staff and site/side marked as required. Patient was prepped and draped in the usual sterile fashion.       Clinical Data: No  additional findings.   Subjective: Chief Complaint  Patient presents with  . Right Shoulder - Pain  The patient is referred today for evaluation treatment of a rotator cuff tear of his right shoulder.  This is apparently been diagnosed with an ultrasound earlier this year.  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  However he has been having shoulder issues for years now with the right shoulder.  This is been well documented in chart.  Is also an issue with alcohol more recently.  He says is more from the DUI and has had to get something straightened out for that.  He does report right shoulder pain and weakness.  He reports difficulty reaching behind with his right shoulder.  He is never had an injection of the shoulder either.  He is never had a shoulder dislocation.  HPI  Review of Systems He currently denies any headache, chest pain, shortness of breath, fever, chills, nausea, vomiting.  He says he is not drinking currently.  Objective: Vital Signs: There were no vitals taken for this visit.  Physical Exam He is alert and oriented x3 and in no acute distress Ortho Exam Examination of his right shoulder shows excellent strength with normal 5 out of 5 strength with abduction and external rotation.  His internal rotation with adduction is significant  limited.  He is negative liftoff.  His biceps tendon appears to be functioning appropriately as well.  The shoulder shows no atrophy in the muscle groups around the shoulder. Specialty Comments:  No specialty comments available.  Imaging: No results found. X-rays independently reviewed of the right shoulder show a normal-appearing shoulder.  The ultrasound report suggests a full-thickness rotator cuff tear of the supraspinatus tendon.  PMFS History: Patient Active Problem List   Diagnosis Date Noted  . Full thickness tear of right subscapularis tendon 07/07/2017  . Biceps tendinitis on left 09/07/2015  . Tendinopathy  of right biceps tendon 10/16/2013   History reviewed. No pertinent past medical history.  History reviewed. No pertinent family history.  History reviewed. No pertinent surgical history. Social History   Occupational History  . Not on file  Tobacco Use  . Smoking status: Never Smoker  Substance and Sexual Activity  . Alcohol use: Yes  . Drug use: No  . Sexual activity: Not on file

## 2017-11-29 ENCOUNTER — Ambulatory Visit (INDEPENDENT_AMBULATORY_CARE_PROVIDER_SITE_OTHER): Payer: Self-pay | Admitting: Physician Assistant

## 2017-12-06 ENCOUNTER — Ambulatory Visit (INDEPENDENT_AMBULATORY_CARE_PROVIDER_SITE_OTHER): Payer: Self-pay | Admitting: Orthopaedic Surgery

## 2018-03-26 ENCOUNTER — Other Ambulatory Visit: Payer: Self-pay | Admitting: Ophthalmology

## 2018-03-26 DIAGNOSIS — S069X0A Unspecified intracranial injury without loss of consciousness, initial encounter: Secondary | ICD-10-CM

## 2018-03-27 ENCOUNTER — Other Ambulatory Visit: Payer: Self-pay

## 2018-03-28 ENCOUNTER — Ambulatory Visit
Admission: RE | Admit: 2018-03-28 | Discharge: 2018-03-28 | Disposition: A | Discharge status: Home / Self Care | Payer: Self-pay | Source: Ambulatory Visit | Attending: Ophthalmology | Admitting: Ophthalmology

## 2018-03-28 DIAGNOSIS — S069X0A Unspecified intracranial injury without loss of consciousness, initial encounter: Secondary | ICD-10-CM

## 2020-06-14 IMAGING — CT CT HEAD W/O CM
3 of 4 series · 15 of 47 positions shown, 18 images · non-contrast
Comparison: None.

CLINICAL DATA: 27-year-old male undergoing clearance for
boxing/NYA.

EXAM:
CT HEAD WITHOUT CONTRAST
TECHNIQUE: Contiguous axial images were obtained from the base of the skull
through the vertex without intravenous contrast.

[Series 2: head 5.00 hr40 s3 ibhc · axial · 0.41mm/px · z∈[-632,-507]mm · 9 of 31 slices shown, 12 images]
[im 3/31  brain]
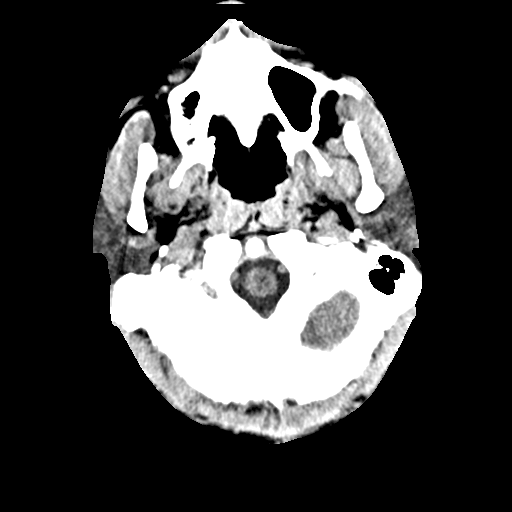
[im 3/31  bone]
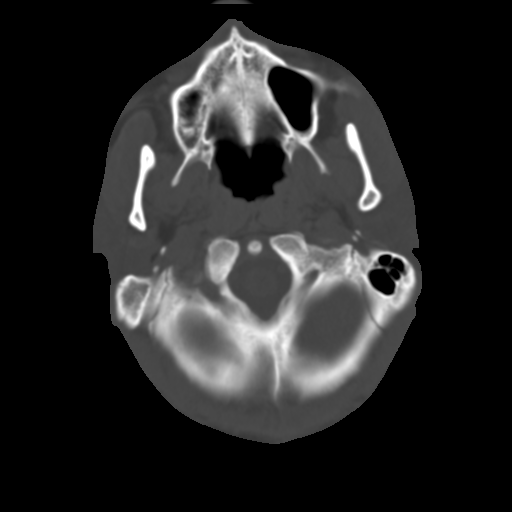
[im 7/31  brain]
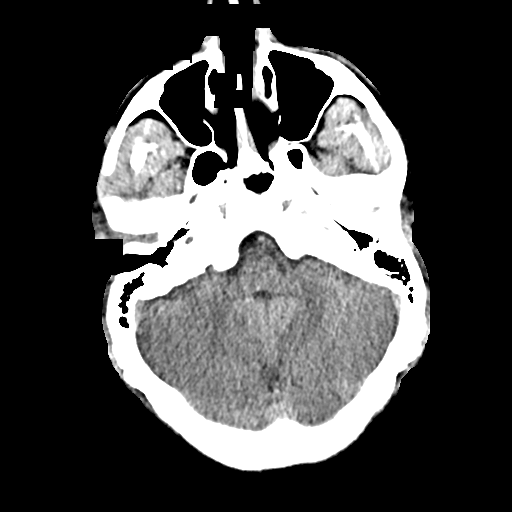
[im 9/31  brain]
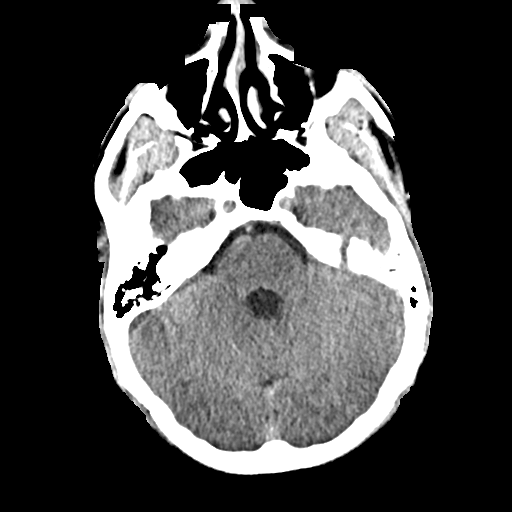
[im 13/31  brain]
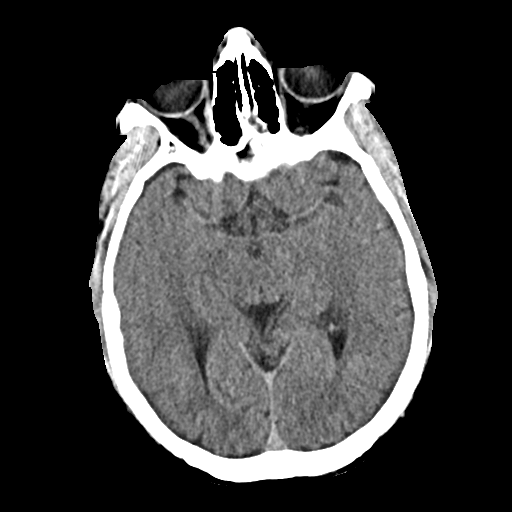
[im 16/31  brain]
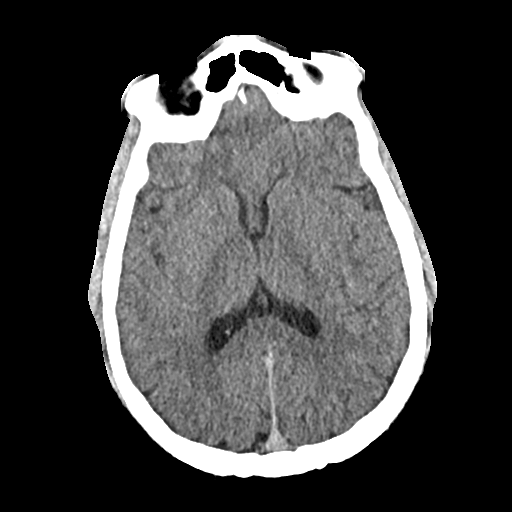
[im 16/31  bone]
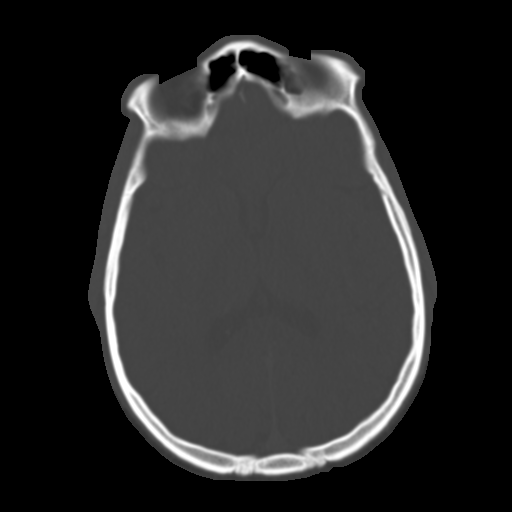
[im 18/31  brain]
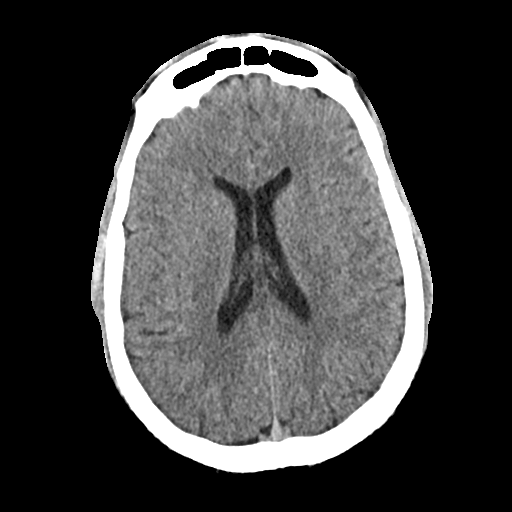
[im 22/31  brain]
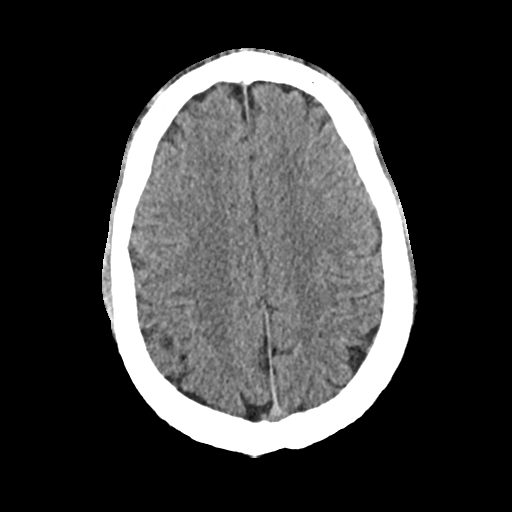
[im 24/31  brain]
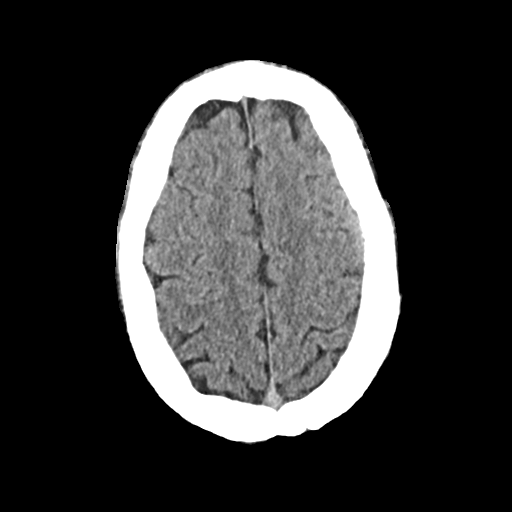
[im 28/31  brain]
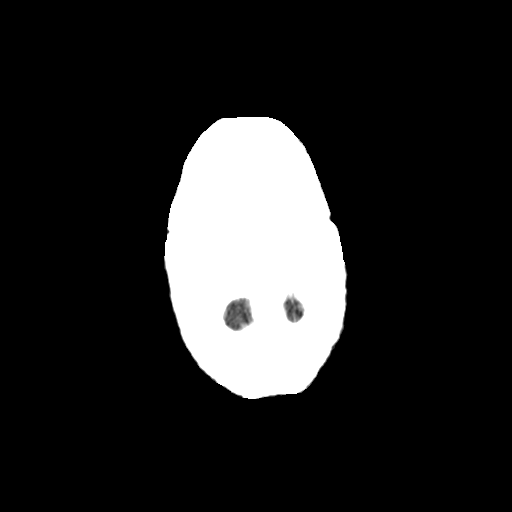
[im 28/31  bone]
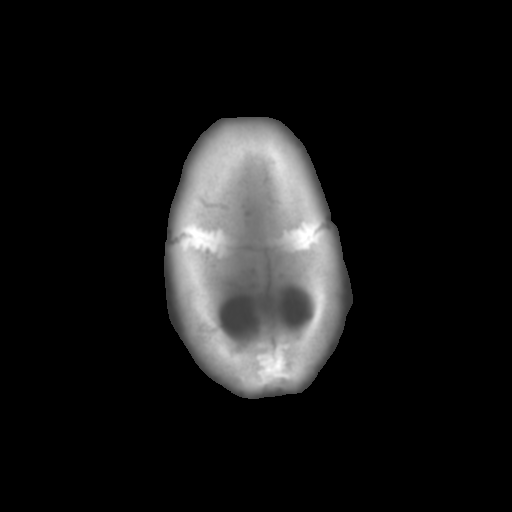

[Series 4: head 3.00 hr40 s3 sag · sagittal · 0.31mm/px · 3 of 57 slices shown]
[im 19/57  brain]
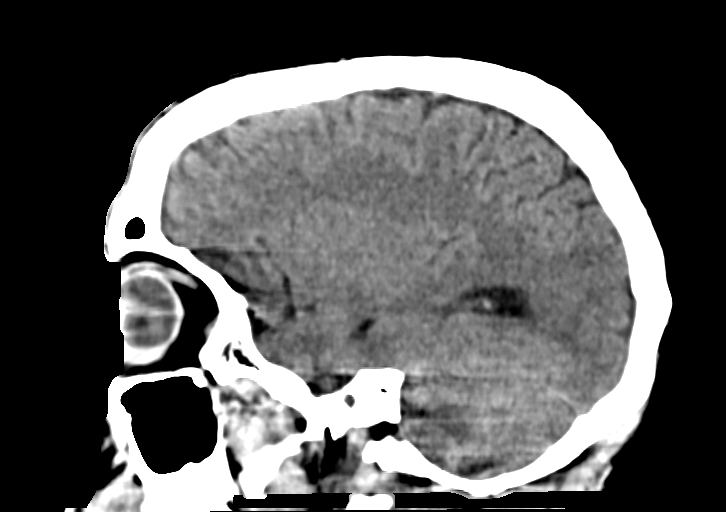
[im 29/57  brain]
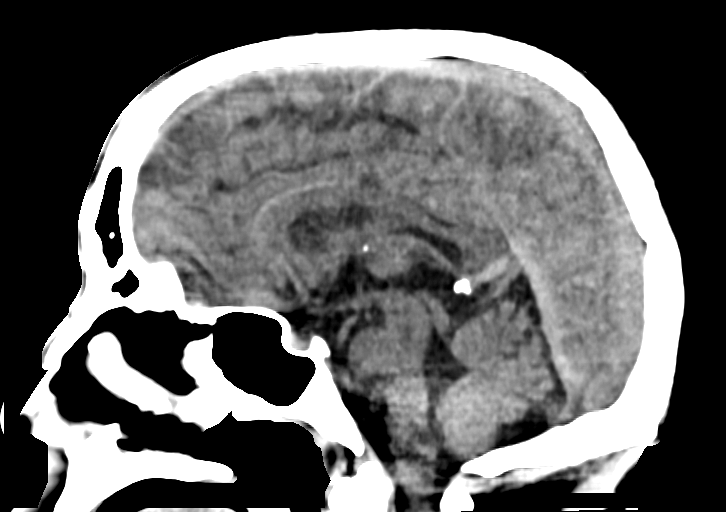
[im 38/57  brain]
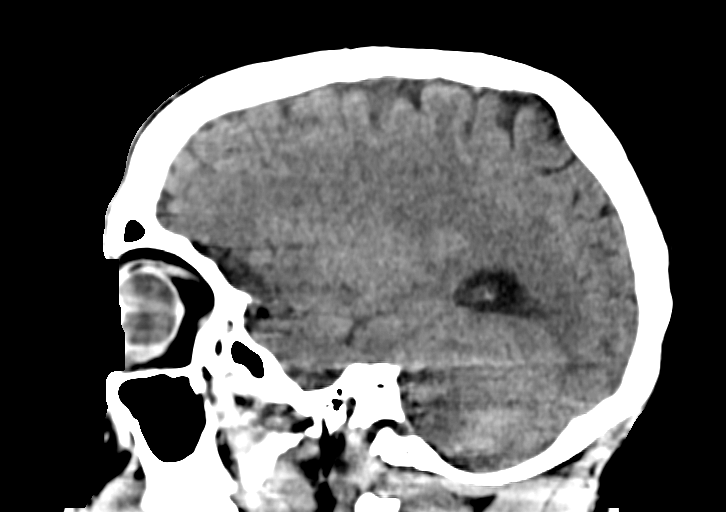

[Series 6: head 3.00 hr40 s3 cor · coronal · 0.31mm/px · 3 of 75 slices shown]
[im 25/75  brain]
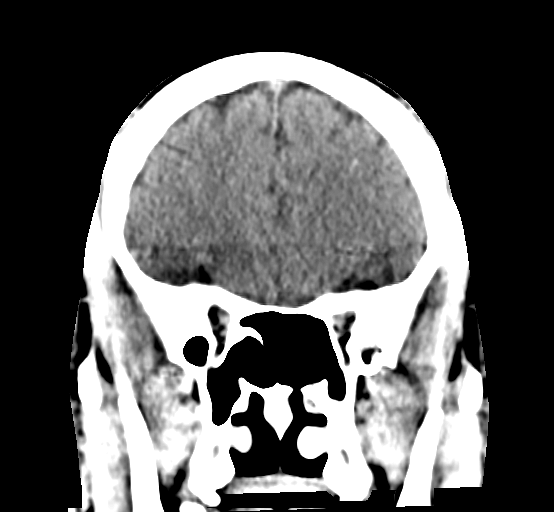
[im 33/75  brain]
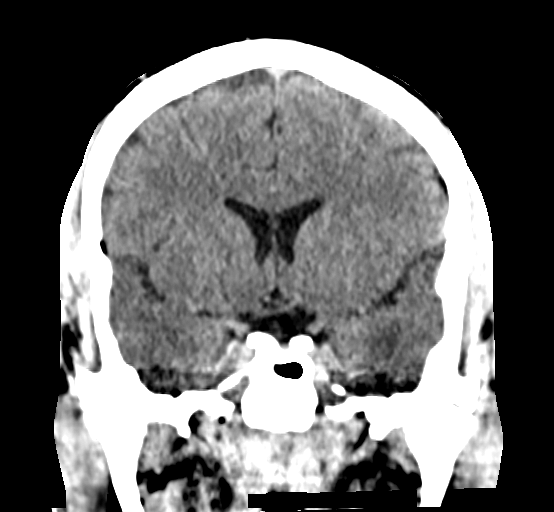
[im 42/75  brain]
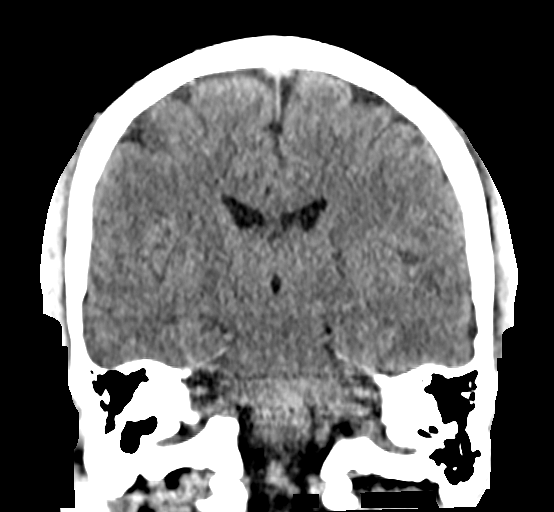

[15 of 47 positions shown; findings below may reference images not displayed]

FINDINGS: Brain: No midline shift, ventriculomegaly, mass effect, evidence of
mass lesion, intracranial hemorrhage or evidence of cortically based
acute infarction. Gray-white matter differentiation is within normal
limits throughout the brain. No cortical encephalomalacia.

Vascular: No suspicious intracranial vascular hyperdensity.

Skull: Negative.

Sinuses/Orbits: Paranasal sinuses and mastoids are well pneumatized.

Other: Visualized orbit soft tissues are within normal limits.
Visualized scalp soft tissues are within normal limits.
IMPRESSION: Negative noncontrast CT appearance of the brain.

## 2021-04-16 ENCOUNTER — Encounter (HOSPITAL_COMMUNITY): Payer: Self-pay

## 2021-04-16 ENCOUNTER — Ambulatory Visit (INDEPENDENT_AMBULATORY_CARE_PROVIDER_SITE_OTHER): Payer: Self-pay

## 2021-04-16 ENCOUNTER — Ambulatory Visit (HOSPITAL_COMMUNITY)
Admission: EM | Admit: 2021-04-16 | Discharge: 2021-04-16 | Disposition: A | Discharge status: Home / Self Care | Payer: Self-pay | Attending: Family Medicine | Admitting: Family Medicine

## 2021-04-16 ENCOUNTER — Other Ambulatory Visit (HOSPITAL_COMMUNITY): Payer: Self-pay

## 2021-04-16 ENCOUNTER — Other Ambulatory Visit: Payer: Self-pay

## 2021-04-16 DIAGNOSIS — R059 Cough, unspecified: Secondary | ICD-10-CM

## 2021-04-16 DIAGNOSIS — J069 Acute upper respiratory infection, unspecified: Secondary | ICD-10-CM

## 2021-04-16 DIAGNOSIS — J4521 Mild intermittent asthma with (acute) exacerbation: Secondary | ICD-10-CM

## 2021-04-16 MED ORDER — ALBUTEROL SULFATE HFA 108 (90 BASE) MCG/ACT IN AERS
1.0000 | INHALATION_SPRAY | RESPIRATORY_TRACT | 0 refills | Status: DC | PRN
  Filled 2021-04-16: qty 18, 17d supply, fill #0

## 2021-04-16 MED ORDER — PREDNISONE 10 MG PO TABS
20.0000 mg | ORAL_TABLET | Freq: Every day | ORAL | 0 refills | Status: AC
  Filled 2021-04-16: qty 10, 5d supply, fill #0

## 2021-04-16 NOTE — ED Triage Notes (Signed)
Pt presents with non productive cough and congestion that causes body aches  for past few days.

## 2021-04-16 NOTE — ED Provider Notes (Signed)
[facility]    CSN: [id number] Arrival date & time: 04/16/21  1039      History   Chief Complaint Chief Complaint  Patient presents with   Cough    HPI Thomas Moss is a 30 y.o. male.    Cough Here for 2 weeks h/o cough and congestion. Is hurting some in his left chest when coughs. No f/c. No n/v/d.   No known h/o asthma. Has maybe heard some wheezing when coughs.  History reviewed. No pertinent past medical history.  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 No pertinent surgical history.     Home Medications    Prior to Admission medications   Medication Sig Start Date End Date Taking? Authorizing Provider  albuterol (VENTOLIN HFA) 108 (90 Base) MCG/ACT inhaler Inhale 1-2 puffs into the lungs every 4 (four) hours as needed for wheezing or shortness of breath. 04/16/21  Yes Zenia Resides, MD  predniSONE (DELTASONE) 10 MG tablet Take 2 tablets (20 mg total) by mouth daily for 5 days. 04/16/21 04/21/21 Yes Regena Delucchi, Janace Aris, MD  cyclobenzaprine (FLEXERIL) 5 MG tablet Take 5 mg by mouth at bedtime.    [provider]  ibuprofen (ADVIL,MOTRIN) 400 MG tablet Take 800 mg by mouth every 8 (eight) hours as needed.     [provider]  nitroGLYCERIN (NITRODUR - DOSED IN MG/24 HR) 0.2 mg/hr patch Place 1/4 patch to the affected area daily. 07/07/17   Nestor Ramp, MD    Family History Family History  Family history unknown: Yes    Social History Social History   Tobacco Use   Smoking status: Never  Substance Use Topics   Alcohol use: Yes   Drug use: No     Allergies   Patient has no known allergies.   Review of Systems Review of Systems  Respiratory:  Positive for cough.     Physical Exam Triage Vital Signs ED Triage Vitals  Enc  Vitals Group     BP 04/16/21 1140 (!) 169/122     Pulse Rate 04/16/21 1140 87     Resp 04/16/21 1140 18     Temp 04/16/21 1140 98.1 F (36.7 C)     Temp Source 04/16/21 1140 Oral     SpO2 04/16/21 1140 100 %     Weight --      Height --      Head Circumference --      Peak Flow --      Pain Score 04/16/21 1142 4     Pain Loc --      Pain Edu? --      Excl. in [place]? --    No data found.  Updated Vital Signs BP (!) 169/122 (BP Location: Left Arm)    Pulse 87    Temp 98.1 F (36.7 C) (Oral)    Resp 18    SpO2 100%   Visual Acuity Right Eye Distance:   Left Eye Distance:   Bilateral Distance:    Right Eye Near:   Left Eye Near:    Bilateral Near:     Physical Exam Constitutional:      General: He is not in acute distress.    Appearance: He is not toxic-appearing.  HENT:     Right Ear: Tympanic membrane  and ear canal normal.     Left Ear: Tympanic membrane and ear canal normal.     Nose: Nose normal.     Mouth/Throat:     Mouth: Mucous membranes are moist.     Pharynx: No oropharyngeal exudate or posterior oropharyngeal erythema.  Eyes:     Extraocular Movements: Extraocular movements intact.     Conjunctiva/sclera: Conjunctivae normal.     Pupils: Pupils are equal, round, and reactive to light.  Cardiovascular:     Rate and Rhythm: Normal rate and regular rhythm.     Heart sounds: No murmur heard. Pulmonary:     Effort: Pulmonary effort is normal. No respiratory distress.     Breath sounds: No wheezing.     Comments: BS coarse. When he coughs, wheezing is audible, when giving his history Musculoskeletal:     Cervical back: Neck supple.  Lymphadenopathy:     Cervical: No cervical adenopathy.  Skin:    Coloration: Skin is not jaundiced or pale.  Neurological:     General: No focal deficit present.     Mental Status: He is alert and oriented to person, place, and time.  Psychiatric:        Behavior: Behavior normal.     UC Treatments / Results  Labs (all  labs ordered are listed, but only abnormal results are displayed) Labs Reviewed - No data to display  EKG   Radiology DG Chest 2 View  Result Date: 04/16/2021 CLINICAL DATA:  Nonproductive cough, congestion EXAM: CHEST - 2 VIEW COMPARISON:  None. FINDINGS: The heart size and mediastinal contours are within normal limits. Both lungs are clear. The visualized skeletal structures are unremarkable. IMPRESSION: No active cardiopulmonary disease. Electronically Signed   By: Elige Ko M.D.   On: 04/16/2021 12:11    Procedures Procedures (including critical care time)  Medications Ordered in UC Medications - No data to display  Initial Impression / Assessment and Plan / UC Course  I have reviewed the triage vital signs and the nursing notes.  Pertinent labs & imaging results that were available during my care of the patient were reviewed by me and considered in my medical decision making (see chart for details).     CXR clear. Will treat for poss asthma exacerbation/bronchitis in the short term Final Clinical Impressions(s) / UC Diagnoses   Final diagnoses:  Viral URI with cough  Mild intermittent asthma with exacerbation     Discharge Instructions      Your chest xray was clear.  Use the inhaler every 4 hours as needed for feeling tight in your chest or for wheezing.  Take prednisone 2 daily for 5 days.      ED Prescriptions     Medication Sig Dispense Auth. Provider   albuterol (VENTOLIN HFA) 108 (90 Base) MCG/ACT inhaler Inhale 1-2 puffs into the lungs every 4 (four) hours as needed for wheezing or shortness of breath. 1 each Zenia Resides, MD   predniSONE (DELTASONE) 10 MG tablet Take 2 tablets (20 mg total) by mouth daily for 5 days. 10 tablet Marlinda Mike Janace Aris, MD      PDMP not reviewed this encounter.   Zenia Resides, MD 04/16/21 [phone]

## 2021-04-16 NOTE — Discharge Instructions (Addendum)
Your chest xray was clear.  Use the inhaler every 4 hours as needed for feeling tight in your chest or for wheezing.  Take prednisone 2 daily for 5 days.

## 2021-07-07 ENCOUNTER — Other Ambulatory Visit (HOSPITAL_COMMUNITY): Payer: Self-pay

## 2021-10-01 ENCOUNTER — Ambulatory Visit (INDEPENDENT_AMBULATORY_CARE_PROVIDER_SITE_OTHER): Payer: Self-pay | Admitting: Family Medicine

## 2021-10-01 VITALS — BP 120/78 | Ht 63.0 in | Wt 140.0 lb

## 2021-10-01 DIAGNOSIS — S060X0S Concussion without loss of consciousness, sequela: Secondary | ICD-10-CM

## 2021-10-02 NOTE — Progress Notes (Signed)
Appt cancelled

## 2021-10-25 ENCOUNTER — Ambulatory Visit: Payer: Self-pay | Admitting: Sports Medicine

## 2021-11-01 ENCOUNTER — Ambulatory Visit: Payer: Self-pay | Admitting: Family Medicine

## 2021-11-03 ENCOUNTER — Ambulatory Visit: Payer: Self-pay | Admitting: Sports Medicine

## 2021-11-12 ENCOUNTER — Encounter: Payer: Self-pay | Admitting: Family Medicine

## 2021-11-12 ENCOUNTER — Ambulatory Visit (INDEPENDENT_AMBULATORY_CARE_PROVIDER_SITE_OTHER): Payer: 59 | Admitting: Family Medicine

## 2021-11-12 VITALS — BP 126/72 | Ht 63.0 in | Wt 137.0 lb

## 2021-11-12 DIAGNOSIS — M25511 Pain in right shoulder: Secondary | ICD-10-CM | POA: Diagnosis not present

## 2021-11-12 DIAGNOSIS — G8929 Other chronic pain: Secondary | ICD-10-CM

## 2021-11-12 DIAGNOSIS — S46811D Strain of other muscles, fascia and tendons at shoulder and upper arm level, right arm, subsequent encounter: Secondary | ICD-10-CM | POA: Diagnosis not present

## 2021-11-12 NOTE — Progress Notes (Signed)
    SUBJECTIVE:   CHIEF COMPLAINT / HPI:   Patient presents for right sided shoulder pain. Had full thickness tear of right subscapularis tendon in 2019. Was supposed to be evaluated for surgery but was not able to for personal reasons. Hoping he can get recommended for surgery. Hurt shoulder several years ago, was sparring, threw a punch and later found out he had a tear. Denies any improvement over the past few years. Has still been working out but cant hit the bag like he used to. Denies taking any medication for his pain.    OBJECTIVE:   BP 126/72 (BP Location: Left Arm, Patient Position: Sitting, Cuff Size: Normal)   Ht 5\' 3"  (1.6 m)   Wt 137 lb (62.1 kg)   BMI 24.27 kg/m    Physical exam General: well appearing, NAD Lungs: CTAB. Normal WOB MSK R Shoulder: Inspection reveals no obvious deformity, atrophy, or asymmetry b/l. No bruising. No swelling Palpation is normal with no TTP over [facility] joint or bicipital groove b/l. Full ROM in flexion, abduction, internal/external rotation b/l NV intact distally b/l Normal scapular function observed b/l Special Tests:  - Impingement: Neg Hawkins, empty can sign. 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  Was not able to get surgery due to personal reasons.  Physical exam benign. He has good range of motion and strength in his shoulder likely from continuing to stay active.  Discussed that given he is well compensated, may not get much benefit from surgery.  He still would like to talk to an orthopedic surgeon, referral placed.     2020, DO [facility]

## 2021-11-12 NOTE — Progress Notes (Signed)
[facility] Attending Note: I have seen and examined this patient. I have discussed this patient with the resident and reviewed the assessment and plan as documented above. I agree with the resident's findings and plan.  Patient fairly well-known to me from prior visits.  Exam reveals some weakness with internal rotation on the right side.  His main complaint is when he tries to do throwing motions with that arm, he feels like he has no strength.  This also affects his desire and ability to return to boxing.  His initial care was in 2019.   Diagnostic ultrasound July 11, 2017 showing full-thickness subscapularis muscle tear with tendon rupture.  He has not had MRI.  It is very unclear to me whether or not there is a surgical intervention that would be beneficial to him and I tried to discuss this with him at length.  I am not sure he really understood that.  We will refer him to orthopedics so they can give him a more detailed opinion, understanding in plan.

## 2021-11-12 NOTE — Assessment & Plan Note (Signed)
Patient presents to discuss options for surgery for full-thickness subscapularis tear that was diagnosed in 2019.  Was not able to get surgery due to personal reasons.  Physical exam benign. He has good range of motion and strength in his shoulder likely from continuing to stay active.  Discussed that given he is well compensated, may not get much benefit from surgery.  He still would like to talk to an orthopedic surgeon, referral placed.

## 2021-11-22 ENCOUNTER — Ambulatory Visit (INDEPENDENT_AMBULATORY_CARE_PROVIDER_SITE_OTHER): Payer: 59 | Admitting: Orthopedic Surgery

## 2021-11-22 ENCOUNTER — Ambulatory Visit (INDEPENDENT_AMBULATORY_CARE_PROVIDER_SITE_OTHER): Payer: 59

## 2021-11-22 DIAGNOSIS — G8929 Other chronic pain: Secondary | ICD-10-CM

## 2021-11-22 DIAGNOSIS — M25511 Pain in right shoulder: Secondary | ICD-10-CM | POA: Diagnosis not present

## 2021-11-22 DIAGNOSIS — S46811A Strain of other muscles, fascia and tendons at shoulder and upper arm level, right arm, initial encounter: Secondary | ICD-10-CM | POA: Diagnosis not present

## 2021-11-23 ENCOUNTER — Other Ambulatory Visit: Payer: Self-pay

## 2021-11-23 DIAGNOSIS — G8929 Other chronic pain: Secondary | ICD-10-CM

## 2021-11-24 ENCOUNTER — Encounter: Payer: Self-pay | Admitting: Orthopedic Surgery

## 2021-11-24 NOTE — Progress Notes (Addendum)
Office Visit Note   Patient: Thomas Moss           Date of Birth: 08-16-1990           MRN: [id number] Visit Date: 11/22/2021 Requested by: Nestor Ramp, MD [address] [place],  [place] [address] PCP: Lavinia Sharps, NP  Subjective: Chief Complaint  Patient presents with   Right Shoulder - Pain    HPI: Thomas Moss is a 31 y.o. male who presents to the office complaining of right shoulder pain.  Patient complains of chronic pain that has been bothering him for several years.  Localizes pain to the anterior aspect of the right shoulder with no radiation.  Notes difficulty sleeping at night.  He feels weak in the arm with certain activities such as throwing, push-up, pull up.  He has been seen previously by Dr. Burnard Leigh and Dr. Magnus Ivan.  He had diagnostic ultrasound in 2019 demonstrating full-thickness subscapularis tear.  He has had prior injection in the shoulder with some relief.  He is right-hand dominant.  Before he could have any definitive treatment of this problem, he was involved in legal difficulty with driving under the influence of alcohol.  He was incarcerated for several years and has now put all that behind him.  Currently looking for long-term work and has a job cleaning buses for now.  He enjoys boxing and calisthenics but has not been able to return to doing either of these as much as he would like to.  No history of prior MRI scan.  No left-sided symptoms.  He does not smoke.  No diabetes.  No significant medical history aside from his shoulder problem..                ROS: All systems reviewed are negative as they relate to the chief complaint within the history of present illness.  Patient denies fevers or chills.  Assessment & Plan: Visit Diagnoses:  1. Chronic right shoulder pain   2. Full thickness tear of right subscapularis tendon, initial encounter     Plan: Patient is a 31 year old male who presents for evaluation of right shoulder pain.  He has  chronic pain that has been bothering him for several years with previous diagnostic ultrasound demonstrating subscapularis tear.  He has increased passive external rotation and internal rotation weakness that is characteristic of subscap injury.  Given the chronicity of his injury, very likely that the tendon has retracted to the point where is not repairable by primary repair.  However, with his ongoing symptoms and lack of improvement, plan to further evaluate with MRI arthrogram of the right shoulder.  Need to evaluate the retraction of the subscapularis and any other pathology in the shoulder that could be a pain generator.  Discussed with Thomas Moss today that this may not be a repairable tendon tear and may require tendon transfer in order to regain functional internal rotation strength.  Follow-up after MRI to review results.  Follow-Up Instructions: No follow-ups on file.   Orders:  Orders Placed This Encounter  Procedures   XR Shoulder Right   No orders of the defined types were placed in this encounter.     Procedures: No procedures performed   Clinical Data: No additional findings.  Objective: Vital Signs: There were no vitals taken for this visit.  Physical Exam:  Constitutional: Patient appears well-developed HEENT:  Head: Normocephalic Eyes:EOM are normal Neck: Normal range of motion Cardiovascular: Normal rate Pulmonary/chest: Effort normal Neurologic:  Patient is alert Skin: Skin is warm Psychiatric: Patient has normal mood and affect  Ortho Exam: Ortho exam demonstrates right shoulder with 100 degrees external rotation, 100 degrees abduction, 170 degrees forward flexion.  This compared with the left shoulder with 70 degrees X rotation, 100 Breze abduction, 170 degrees forward flexion.  Excellent strength of supraspinatus and infraspinatus bilaterally.  Significant weakness of subscapularis of the right shoulder relative to the left.  Positive belly press test.  Positive  liftoff sign.  Mild tenderness over the bicipital groove of the right shoulder with no tenderness over the left.  No tenderness over either AC joint.  No tenderness over the cervical spine.  Negative Spurling sign.  5/5 motor strength of bilateral grip strength, finger abduction, pronation/supination, bicep, tricep, deltoid.  Specialty Comments:  No specialty comments available.  Imaging: No results found.   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  Family History  Family history unknown: Yes    No past surgical history on file. Social History   Occupational History   Not on file  Tobacco Use   Smoking status: Never   Smokeless tobacco: Not on file  Substance and Sexual Activity   Alcohol use: Yes   Drug use: No   Sexual activity: Not on file

## 2021-11-25 ENCOUNTER — Telehealth: Payer: Self-pay | Admitting: Orthopedic Surgery

## 2021-11-25 NOTE — Telephone Encounter (Signed)
Pt called asking to resend fax for MRI. Pt states the MRI facility stated they have not received the referral. Please call pt when resent. Pt phone number is [phone].

## 2021-11-25 NOTE — Telephone Encounter (Signed)
I called the patient and explained that we had to get the MRI authorized with his insurance first. It was authorized and I emailed Sheralyn Boatman C with [facility] scheduling - they will contact the patient - he is aware he will be called for an appointment.

## 2021-11-29 ENCOUNTER — Ambulatory Visit (HOSPITAL_COMMUNITY)
Admission: EM | Admit: 2021-11-29 | Discharge: 2021-11-29 | Disposition: A | Discharge status: Home / Self Care | Payer: 59

## 2021-11-29 ENCOUNTER — Encounter (HOSPITAL_COMMUNITY): Payer: Self-pay | Admitting: Emergency Medicine

## 2021-11-29 ENCOUNTER — Other Ambulatory Visit (HOSPITAL_COMMUNITY): Payer: Self-pay

## 2021-11-29 DIAGNOSIS — L282 Other prurigo: Secondary | ICD-10-CM | POA: Diagnosis not present

## 2021-11-29 MED ORDER — CETIRIZINE HCL 10 MG PO TABS
10.0000 mg | ORAL_TABLET | Freq: Every day | ORAL | 1 refills | Status: AC
  Filled 2021-11-29 – 2022-05-17 (×2): qty 30, 30d supply, fill #0

## 2021-11-29 MED ORDER — FAMOTIDINE 40 MG PO TABS
40.0000 mg | ORAL_TABLET | Freq: Every day | ORAL | 1 refills | Status: DC
  Filled 2021-11-29: qty 30, 30d supply, fill #0

## 2021-11-29 MED ORDER — METHYLPREDNISOLONE ACETATE 80 MG/ML IJ SUSP
INTRAMUSCULAR | Status: AC
  Filled 2021-11-29: qty 1

## 2021-11-29 MED ORDER — METHYLPREDNISOLONE ACETATE 80 MG/ML IJ SUSP
60.0000 mg | Freq: Once | INTRAMUSCULAR | Status: AC
  Administered 2021-11-29: 60 mg via INTRAMUSCULAR

## 2021-11-29 NOTE — ED Provider Notes (Signed)
[facility]    CSN: [id number] Arrival date & time: 11/29/21  1022      History   Chief Complaint Chief Complaint  Patient presents with   Itching    HPI Thomas Moss is a 31 y.o. male.   Patient presents today with a several day history of widespread pruritic rash.  Reports this is primarily localized to his upper back, bilateral axilla, anterior thighs but generalized throughout his body.  He denies history of dermatological condition including eczema or psoriasis.  Does report changing his soap recently but denies additional changes to personal hygiene products including detergent.  Denies any recent medication changes or antibiotic use.  Denies any sick contacts with similar symptoms at home.  He has not tried any over-the-counter medication for symptom management.  Denies any history of allergies including anaphylaxis.  Denies any associated shortness of breath, swelling of his throat, dysphagia, muffled voice.    History reviewed. No pertinent past medical history.  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 No pertinent surgical history.     Home Medications    Prior to Admission medications   Medication Sig Start Date End Date Taking? Authorizing Provider  cetirizine (ZYRTEC ALLERGY) 10 MG tablet Take 1 tablet (10 mg total) by mouth daily. 11/29/21  Yes Jaleisa Brose K, PA-C  famotidine (PEPCID) 40 MG tablet Take 1 tablet (40 mg total) by mouth at bedtime. 11/29/21  Yes Jessup Ogas K, PA-C  hydrochlorothiazide (HYDRODIURIL) 25 MG tablet Take 25 mg by mouth daily. 10/18/21  Yes [provider]  losartan (COZAAR) 50 MG tablet Take 50 mg by mouth daily. 10/18/21  Yes [provider]  nitroGLYCERIN (NITRODUR - DOSED IN MG/24 HR) 0.2 mg/hr patch Place 1/4 patch to the  affected area daily. 07/07/17  Yes Dickie La, MD    Family History Family History  Family history unknown: Yes    Social History Social History   Tobacco Use   Smoking status: Never  Substance Use Topics   Alcohol use: Yes   Drug use: No     Allergies   Patient has no known allergies.   Review of Systems Review of Systems  Constitutional:  Negative for activity change, appetite change, fatigue and fever.  HENT:  Negative for sore throat, trouble swallowing and voice change.   Musculoskeletal:  Negative for arthralgias and myalgias.  Skin:  Positive for rash.  Neurological:  Negative for dizziness, light-headedness and headaches.     Physical Exam Triage Vital Signs ED Triage Vitals  Enc Vitals Group     BP 11/29/21 1124 (!) 143/103     Pulse Rate 11/29/21 1124 77     Resp 11/29/21 1124 18     Temp 11/29/21 1124 98.5 F (36.9 C)     Temp Source 11/29/21 1124 Oral     SpO2 11/29/21 1124 99 %     Weight 11/29/21 1127 137 lb (62.1 kg)     Height 11/29/21 1127 5\' 3"  (1.6 m)     Head Circumference --      Peak Flow --      Pain Score 11/29/21 1126 0     Pain Loc --      Pain Edu? --      Excl. in [place]? --  No data found.  Updated Vital Signs BP (!) 143/103 (BP Location: Left Arm)   Pulse 77   Temp 98.5 F (36.9 C) (Oral)   Resp 18   Ht 5\' 3"  (1.6 m)   Wt 137 lb (62.1 kg)   SpO2 99%   BMI 24.27 kg/m   Visual Acuity Right Eye Distance:   Left Eye Distance:   Bilateral Distance:    Right Eye Near:   Left Eye Near:    Bilateral Near:     Physical Exam Vitals reviewed.  Constitutional:      General: He is awake.     Appearance: Normal appearance. He is well-developed. He is not ill-appearing.     Comments: Very pleasant male appears stated age in no acute distress sitting comfortably in exam room  HENT:     Head: Normocephalic and atraumatic.     Mouth/Throat:     Mouth: Mucous membranes are moist.     Pharynx: Uvula midline. No  oropharyngeal exudate or posterior oropharyngeal erythema.  Cardiovascular:     Rate and Rhythm: Normal rate and regular rhythm.     Heart sounds: Normal heart sounds, S1 normal and S2 normal. No murmur heard. Pulmonary:     Effort: Pulmonary effort is normal.     Breath sounds: Normal breath sounds. No stridor. No wheezing, rhonchi or rales.     Comments: Clear to auscultation bilaterally Abdominal:     Palpations: Abdomen is soft.     Tenderness: There is no abdominal tenderness.  Skin:    Findings: Rash present. Rash is macular and papular.     Comments: Maculopapular rash with excoriation noted trunk and bilateral axilla.  Neurological:     Mental Status: He is alert.  Psychiatric:        Behavior: Behavior is cooperative.      UC Treatments / Results  Labs (all labs ordered are listed, but only abnormal results are displayed) Labs Reviewed - No data to display  EKG   Radiology No results found.  Procedures Procedures (including critical care time)  Medications Ordered in UC Medications  methylPREDNISolone acetate (DEPO-MEDROL) injection 60 mg (has no administration in time range)    Initial Impression / Assessment and Plan / UC Course  I have reviewed the triage vital signs and the nursing notes.  Pertinent labs & imaging results that were available during my care of the patient were reviewed by me and considered in my medical decision making (see chart for details).     Concern for allergic etiology given clinical presentation.  Patient was started on H1 and H2 blockade.  He was given injection of Depo-Medrol 60 mg in clinic.  Denies history of diabetes.  Recommended hypoallergenic soaps and detergents.  Discussed that if he has persistent/recurrent symptoms he should follow-up with PCP to consider allergist referral.  If he has any worsening symptoms including GI symptoms, shortness of breath, throat swelling, muffled voice needs to go to the emergency room to  which he expressed understanding.  Strict return precautions given.  Work excuse note provided.  Final Clinical Impressions(s) / UC Diagnoses   Final diagnoses:  Pruritic rash     Discharge Instructions      I am concerned that you are having allergic reaction to something.  I do recommend you stop allergenic soaps and detergents.  Start cetirizine in the morning.  Start famotidine at night.  We gave an injection of steroids today.  You have any worsening symptoms  including widespread rash, worsening itching, fever, difficulty swallowing, swelling of your throat, shortness of breath you need to go to the emergency room.    ED Prescriptions     Medication Sig Dispense Auth. Provider   cetirizine (ZYRTEC ALLERGY) 10 MG tablet Take 1 tablet (10 mg total) by mouth daily. 30 tablet Karel Mowers K, PA-C   famotidine (PEPCID) 40 MG tablet Take 1 tablet (40 mg total) by mouth at bedtime. 30 tablet Ahnyla Mendel, Noberto Retort, PA-C      PDMP not reviewed this encounter.   Jeani Hawking, PA-C 11/29/21 1217

## 2021-11-29 NOTE — ED Triage Notes (Signed)
Patient states that he has started itching all over for the last couple of days.  He noticed a rash under his right arm and back.  He was using a Dove body wash, then switched to a bar soap then a vegan soap.  Patient denies taken any Benadryl for the itching.

## 2021-11-29 NOTE — Discharge Instructions (Signed)
I am concerned that you are having allergic reaction to something.  I do recommend you stop allergenic soaps and detergents.  Start cetirizine in the morning.  Start famotidine at night.  We gave an injection of steroids today.  You have any worsening symptoms including widespread rash, worsening itching, fever, difficulty swallowing, swelling of your throat, shortness of breath you need to go to the emergency room.

## 2021-11-30 ENCOUNTER — Ambulatory Visit (HOSPITAL_COMMUNITY): Admission: RE | Admit: 2021-11-30 | Payer: 59 | Source: Ambulatory Visit

## 2021-12-10 ENCOUNTER — Ambulatory Visit (HOSPITAL_COMMUNITY)
Admission: RE | Admit: 2021-12-10 | Discharge: 2021-12-10 | Disposition: A | Discharge status: Home / Self Care | Payer: 59 | Source: Ambulatory Visit | Attending: Orthopedic Surgery | Admitting: Orthopedic Surgery

## 2021-12-10 DIAGNOSIS — M25511 Pain in right shoulder: Secondary | ICD-10-CM | POA: Insufficient documentation

## 2021-12-10 DIAGNOSIS — G8929 Other chronic pain: Secondary | ICD-10-CM | POA: Insufficient documentation

## 2021-12-10 MED ORDER — LIDOCAINE HCL 1 % IJ SOLN
INTRAMUSCULAR | Status: AC
  Filled 2021-12-10: qty 20

## 2021-12-10 MED ORDER — GADOBUTROL 1 MMOL/ML IV SOLN
0.1000 mL | Freq: Once | INTRAVENOUS | Status: AC | PRN
Start: 2021-12-10 — End: 2021-12-10
  Administered 2021-12-10: 0.1 mL

## 2021-12-10 MED ORDER — LIDOCAINE HCL (PF) 1 % IJ SOLN
INTRAMUSCULAR | Status: AC
  Administered 2021-12-10: 5 mL via INTRAMUSCULAR
  Filled 2021-12-10: qty 15

## 2021-12-10 MED ORDER — IOHEXOL 300 MG/ML  SOLN
50.0000 mL | Freq: Once | INTRAMUSCULAR | Status: AC | PRN
  Administered 2021-12-10: 10 mL via INTRA_ARTICULAR

## 2021-12-13 ENCOUNTER — Ambulatory Visit (INDEPENDENT_AMBULATORY_CARE_PROVIDER_SITE_OTHER): Payer: 59 | Admitting: Orthopedic Surgery

## 2021-12-13 ENCOUNTER — Encounter: Payer: Self-pay | Admitting: Orthopedic Surgery

## 2021-12-13 DIAGNOSIS — M75121 Complete rotator cuff tear or rupture of right shoulder, not specified as traumatic: Secondary | ICD-10-CM

## 2021-12-13 NOTE — Progress Notes (Signed)
Office Visit Note   Patient: Thomas Moss           Date of Birth: [birth year]           MRN: [id number] Visit Date: 12/13/2021 Requested by: Lavinia Sharps, NP [address] [place],  [place] [address] PCP: Lavinia Sharps, NP  Subjective: Chief Complaint  Patient presents with   Other     Scan review    HPI: Delaney Meigs is a 31 year old patient here to review MRI scan of his right shoulder.  He reports chronic right shoulder pain with weakness with forward flexion type activities.  Want to get back to an active lifestyle.  MRI scan is reviewed and it does show retracted subscapularis tear but interestingly no atrophy of the muscle.  Perhaps a few intact fibers superiorly.  Infraspinatus supraspinatus intact.              ROS: All systems reviewed are negative as they relate to the chief complaint within the history of present illness.  Patient denies  fevers or chills.   Assessment & Plan: Visit Diagnoses:  1. Complete tear of right rotator cuff, unspecified whether traumatic     Plan: Impression is right shoulder pain with retracted subscapularis tear and no muscle atrophy.  Plan is right shoulder arthroscopy with biceps tendon release, biceps tenodesis, and either subscapularis repair with mobilization versus tendon transfer of the teres and latissimus.  The risk and benefits of the procedure discussed with the patient including not limited to infection nerve vessel damage incomplete pain relief, recurrent failure of the rotator cuff repair and incomplete restoration of function.  Expected rehab course is also discussed.  All questions answered.  First attempt will be at primary repair and if that is not possible we will proceed with tendon transfer.  That may require takedown and reapproximation of the pec tendon.  Follow-Up Instructions: No follow-ups on file.   Orders:  No orders of the defined types were placed in this encounter.  No orders of the defined types were  placed in this encounter.     Procedures: No procedures performed   Clinical Data: No additional findings.  Objective: Vital Signs: There were no vitals taken for this visit.  Physical Exam:   Constitutional: Patient appears well-developed HEENT:  Head: Normocephalic Eyes:EOM are normal Neck: Normal range of motion Cardiovascular: Normal rate Pulmonary/chest: Effort normal Neurologic: Patient is alert Skin: Skin is warm Psychiatric: Patient has normal mood and affect   Ortho Exam: Ortho exam demonstrates maintained passive range of motion with more external rotation on the right than the left to about 90 degrees.  Infraspinatus supraspinatus strength intact on the right subscap strength is weak at 3 out of 5.  No Popeye deformity present.  Deltoid is functional.  Motor or sensory function to the hand intact  Specialty Comments:  No specialty comments available.  Imaging: No results found.   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  Family History  Family history unknown: Yes    No past surgical history on file. Social History   Occupational History   Not on file  Tobacco Use   Smoking status: Never   Smokeless tobacco: Not on file  Substance and Sexual Activity   Alcohol use: Yes  Drug use: No   Sexual activity: Not on file

## 2021-12-28 ENCOUNTER — Other Ambulatory Visit: Payer: Self-pay

## 2021-12-29 NOTE — Pre-Procedure Instructions (Addendum)
Surgical Instructions    Your procedure is scheduled on Tuesday September 19.  Report to [facility] Main Entrance "A" at 9:30 A.M., then check in with the Admitting office.  Call this number if you have problems the morning of surgery:  [phone]   If you have any questions prior to your surgery date call [phone]: Open Monday-Friday 8am-4pm    Remember:  Do not eat after midnight the night before your surgery  You may drink clear liquids until 8:30 the morning of your surgery.   Clear liquids allowed are: Water, Non-Citrus Juices (without pulp), Carbonated Beverages, Clear Tea, Black Coffee ONLY (NO MILK, CREAM OR POWDERED CREAMER of any kind), and Gatorade    Take these medicines the morning of surgery with A SIP OF WATER:  IF NEEDED: cetirizine (ZYRTEC ALLERGY) famotidine (PEPCID)    As of today, STOP taking any Aspirin (unless otherwise instructed by your surgeon) Aleve, Naproxen, Ibuprofen, Motrin, Advil, Goody's, BC's, all herbal medications, fish oil, and all vitamins.           Do not wear jewelry. Do not wear lotions, powders, cologne or deodorant. Men may shave face and neck. Do not bring valuables to the hospital. [facility] is not responsible for any belongings or valuables.    Do NOT Smoke (Tobacco/Vaping)  24 hours prior to your procedure  If you use a CPAP at night, you may bring your mask for your overnight stay.   Contacts, glasses, hearing aids, dentures or partials may not be worn into surgery, please bring cases for these belongings   For patients admitted to the hospital, discharge time will be determined by your treatment team.   Patients discharged the day of surgery will not be allowed to drive home, and someone needs to stay with them for 24 hours.   SURGICAL WAITING ROOM VISITATION Patients having surgery or a procedure may have no more than 2 support people in the waiting area - these visitors may rotate.   Children under the age of 41  must have an adult with them who is not the patient. If the patient needs to stay at the hospital during part of their recovery, the visitor guidelines for inpatient rooms apply. Pre-op nurse will coordinate an appropriate time for 1 support person to accompany patient in pre-op.  This support person may not rotate.   Please refer to the [facility] website for the visitor guidelines for Inpatients (after your surgery is over and you are in a regular room).    Special instructions:    Oral Hygiene is also important to reduce your risk of infection.  Remember - BRUSH YOUR TEETH THE MORNING OF SURGERY WITH YOUR REGULAR TOOTHPASTE  [facility]- Preparing for Shoulder Surgery  ?  Before surgery, you can play an important role. Because skin is not sterile, your skin needs to be as free of germs as possible. You can reduce the number of germs on your skin by using the following products.   1). Benzoyl Peroxide Gel: reduces the number of germs present on the skin   *Applied twice a day to shoulder area starting two days before surgery     2). Chlorhexidine Gluconate (CHG) Soap: An antiseptic cleaner that kills germs and bonds with the skin to continue killing germs even after washing   *Used for showering the night before surgery and morning of surgery   ?   Please follow these instructions carefully:     1). BENZOYL PEROXIDE 5%  GEL (Please do not use if you have an allergy to benzoyl peroxide.   If your skin becomes reddened/irritated stop using the benzoyl peroxide)     Starting TWO DAYS BEFORE surgery:    Apply benzoyl peroxide in the morning and at night. Apply after taking a shower. If you are not taking a shower clean entire shoulder front, back, and side along with the armpit with a clean wet washcloth.     Place a quarter-sized amount of gel on your shoulder and rub in thoroughly, making sure to cover the front, back, and side of your shoulder, along with the armpit.                            Do this twice a day for two days.  (Last application is the night before surgery, AFTER using the CHG soap as described below).   Do NOT apply benzoyl peroxide gel on the day of surgery.   2 days before ____ AM   ____ PM              1 day before ____ AM   ____ PM    2) CHG Soap: Please do not use if you have an allergy to CHG or antibacterial soaps. If your skin becomes reddened/irritated stop using the CHG.  Do not shave (including legs and underarms) for at least 48 hours prior to first CHG shower. It is OK to shave your face.   Please follow these instructions carefully.   Shower the NIGHT BEFORE SURGERY (before applying benzoyl peroxide gel) and the MORNING OF SURGERY with CHG Soap.   If you chose to wash your hair, wash your hair first as usual with your normal shampoo.  After you shampoo, rinse your hair and body thoroughly to remove the shampoo.  Use CHG as you would any other liquid soap. You can apply CHG directly to the skin and wash gently with a scrungie or a clean washcloth.   Apply the CHG Soap to your body ONLY FROM THE NECK DOWN.  Do not use on open wounds or open sores. Avoid contact with your eyes, ears, mouth and genitals (private parts). Wash Face and genitals (private parts) with your normal soap.   Wash thoroughly, paying special attention to the area where your surgery will be performed.  Thoroughly rinse your body with warm water from the neck down.  DO NOT shower/wash with your normal soap after using and rinsing off the CHG Soap.  Pat yourself dry with a CLEAN TOWEL.  Wear CLEAN PAJAMAS to bed the night before surgery  Place CLEAN SHEETS on your bed the night of your first shower and DO NOT SLEEP WITH PETS.  Oral Hygiene is also important to reduce your risk of infection.  Remember - BRUSH YOUR TEETH THE MORNING OF SURGERY WITH YOUR REGULAR TOOTHPASTE  Day of Surgery: Wear Clean/Comfortable clothing the morning of surgery Do not  apply any deodorants/lotions.   Remember to brush your teeth WITH YOUR REGULAR TOOTHPASTE.    If you received a COVID test during your pre-op visit, it is requested that you wear a mask when out in public, stay away from anyone that may not be feeling well, and notify your surgeon if you develop symptoms. If you have been in contact with anyone that has tested positive in the last 10 days, please notify your surgeon.    Please read over the following fact sheets  that you were given.

## 2021-12-30 ENCOUNTER — Encounter (HOSPITAL_COMMUNITY)
Admission: RE | Admit: 2021-12-30 | Discharge: 2021-12-30 | Disposition: A | Discharge status: Home / Self Care | Payer: Commercial Managed Care - HMO | Source: Ambulatory Visit | Attending: Orthopedic Surgery | Admitting: Orthopedic Surgery

## 2021-12-30 ENCOUNTER — Other Ambulatory Visit: Payer: Self-pay

## 2021-12-30 ENCOUNTER — Encounter (HOSPITAL_COMMUNITY): Payer: Self-pay

## 2021-12-30 VITALS — BP 127/86 | HR 70 | Temp 99.0°F | Resp 18 | Ht 63.0 in | Wt 139.7 lb

## 2021-12-30 DIAGNOSIS — M67921 Unspecified disorder of synovium and tendon, right upper arm: Secondary | ICD-10-CM | POA: Diagnosis not present

## 2021-12-30 DIAGNOSIS — Z01812 Encounter for preprocedural laboratory examination: Secondary | ICD-10-CM | POA: Insufficient documentation

## 2021-12-30 HISTORY — DX: Essential (primary) hypertension: I10

## 2021-12-30 HISTORY — DX: Gastro-esophageal reflux disease without esophagitis: K21.9

## 2021-12-30 LAB — CBC
HCT: 46.7 % (ref 39.0–52.0)
Hemoglobin: 15.8 g/dL (ref 13.0–17.0)
MCH: 28.6 pg (ref 26.0–34.0)
MCHC: 33.8 g/dL (ref 30.0–36.0)
MCV: 84.6 fL (ref 80.0–100.0)
Platelets: 174 10*3/uL (ref 150–400)
RBC: 5.52 MIL/uL (ref 4.22–5.81)
RDW: 13.4 % (ref 11.5–15.5)
WBC: 5.5 10*3/uL (ref 4.0–10.5)
nRBC: 0 % (ref 0.0–0.2)

## 2021-12-30 LAB — SURGICAL PCR SCREEN
MRSA, PCR: NEGATIVE
Staphylococcus aureus: NEGATIVE

## 2021-12-30 NOTE — Progress Notes (Signed)
PCP - Lavinia Sharps NP Cardiologist - denies  PPM/ICD - denies  Chest x-ray - n/a EKG - n/a Stress Test - denies ECHO - denies Cardiac Cath - denies  Sleep Study - denies CPAP - n/a  No diabetes  As of today, STOP taking any Aspirin (unless otherwise instructed by your surgeon) Aleve, Naproxen, Ibuprofen, Motrin, Advil, Goody's, BC's, all herbal medications, fish oil, and all vitamins.  ERAS Protcol - yes PRE-SURGERY Ensure or G2- n/a  COVID TEST- n/a  Anesthesia review: no  Patient denies shortness of breath, fever, cough and chest pain at PAT appointment   All instructions explained to the patient, with a verbal understanding of the material. Patient agrees to go over the instructions while at home for a better understanding. Patient also instructed to self quarantine after being tested for COVID-19. The opportunity to ask questions was provided.

## 2022-01-11 ENCOUNTER — Ambulatory Visit (HOSPITAL_COMMUNITY): Payer: Commercial Managed Care - HMO | Admitting: Certified Registered Nurse Anesthetist

## 2022-01-11 ENCOUNTER — Encounter (HOSPITAL_COMMUNITY)
Admission: RE | Disposition: A | Discharge status: Home / Self Care | Payer: Self-pay | Source: Home / Self Care | Attending: Orthopedic Surgery

## 2022-01-11 ENCOUNTER — Encounter (HOSPITAL_COMMUNITY): Payer: Self-pay | Admitting: Orthopedic Surgery

## 2022-01-11 ENCOUNTER — Ambulatory Visit (HOSPITAL_COMMUNITY)
Admission: RE | Admit: 2022-01-11 | Discharge: 2022-01-11 | Disposition: A | Discharge status: Home / Self Care | Payer: Commercial Managed Care - HMO | Attending: Orthopedic Surgery | Admitting: Orthopedic Surgery

## 2022-01-11 ENCOUNTER — Other Ambulatory Visit: Payer: Self-pay

## 2022-01-11 DIAGNOSIS — M7521 Bicipital tendinitis, right shoulder: Secondary | ICD-10-CM | POA: Insufficient documentation

## 2022-01-11 DIAGNOSIS — Z9889 Other specified postprocedural states: Secondary | ICD-10-CM | POA: Diagnosis not present

## 2022-01-11 DIAGNOSIS — I10 Essential (primary) hypertension: Secondary | ICD-10-CM | POA: Diagnosis not present

## 2022-01-11 DIAGNOSIS — R011 Cardiac murmur, unspecified: Secondary | ICD-10-CM | POA: Insufficient documentation

## 2022-01-11 DIAGNOSIS — Z538 Procedure and treatment not carried out for other reasons: Secondary | ICD-10-CM | POA: Diagnosis not present

## 2022-01-11 DIAGNOSIS — M75101 Unspecified rotator cuff tear or rupture of right shoulder, not specified as traumatic: Secondary | ICD-10-CM | POA: Insufficient documentation

## 2022-01-11 SURGERY — SHOULDER ARTHROSCOPY WITH OPEN ROTATOR CUFF REPAIR AND DISTAL CLAVICLE ACROMINECTOMY
Anesthesia: General | Laterality: Right

## 2022-01-11 MED ORDER — TRANEXAMIC ACID-NACL 1000-0.7 MG/100ML-% IV SOLN
INTRAVENOUS | Status: AC
  Filled 2022-01-11: qty 100

## 2022-01-11 MED ORDER — ORAL CARE MOUTH RINSE: 15.0000 mL | Freq: Once | OROMUCOSAL | Status: AC

## 2022-01-11 MED ORDER — CHLORHEXIDINE GLUCONATE 0.12 % MT SOLN: 15.0000 mL | Freq: Once | OROMUCOSAL | Status: AC

## 2022-01-11 MED ORDER — FENTANYL CITRATE (PF) 250 MCG/5ML IJ SOLN
INTRAMUSCULAR | Status: AC
  Filled 2022-01-11: qty 5

## 2022-01-11 MED ORDER — POVIDONE-IODINE 7.5 % EX SOLN
Freq: Once | CUTANEOUS | Status: DC
  Filled 2022-01-11: qty 118

## 2022-01-11 MED ORDER — PROPOFOL 10 MG/ML IV BOLUS
INTRAVENOUS | Status: AC
  Filled 2022-01-11: qty 20

## 2022-01-11 MED ORDER — LIDOCAINE 2% (20 MG/ML) 5 ML SYRINGE
INTRAMUSCULAR | Status: AC
  Filled 2022-01-11: qty 5

## 2022-01-11 MED ORDER — ROCURONIUM BROMIDE 10 MG/ML (PF) SYRINGE
PREFILLED_SYRINGE | INTRAVENOUS | Status: AC
  Filled 2022-01-11: qty 10

## 2022-01-11 MED ORDER — MIDAZOLAM HCL 2 MG/2ML IJ SOLN
INTRAMUSCULAR | Status: AC
Start: 2022-01-11 — End: ?
  Filled 2022-01-11: qty 2

## 2022-01-11 MED ORDER — CEFAZOLIN SODIUM-DEXTROSE 2-4 GM/100ML-% IV SOLN
INTRAVENOUS | Status: AC
  Filled 2022-01-11: qty 100

## 2022-01-11 MED ORDER — DEXAMETHASONE SODIUM PHOSPHATE 10 MG/ML IJ SOLN
INTRAMUSCULAR | Status: AC
Start: 2022-01-11 — End: ?
  Filled 2022-01-11: qty 1

## 2022-01-11 MED ORDER — LACTATED RINGERS IV SOLN: INTRAVENOUS | Status: DC

## 2022-01-11 MED ORDER — CHLORHEXIDINE GLUCONATE 0.12 % MT SOLN
OROMUCOSAL | Status: AC
  Administered 2022-01-11: 15 mL via OROMUCOSAL
  Filled 2022-01-11: qty 15

## 2022-01-11 MED ORDER — FENTANYL CITRATE (PF) 100 MCG/2ML IJ SOLN
INTRAMUSCULAR | Status: AC
  Filled 2022-01-11: qty 2

## 2022-01-11 MED ORDER — POVIDONE-IODINE 10 % EX SWAB
2.0000 | Freq: Once | CUTANEOUS | Status: AC
  Administered 2022-01-11: 2 via TOPICAL

## 2022-01-11 MED ORDER — TRANEXAMIC ACID-NACL 1000-0.7 MG/100ML-% IV SOLN: 1000.0000 mg | INTRAVENOUS | Status: DC

## 2022-01-11 MED ORDER — MIDAZOLAM HCL 2 MG/2ML IJ SOLN
INTRAMUSCULAR | Status: AC
  Filled 2022-01-11: qty 2

## 2022-01-11 MED ORDER — CEFAZOLIN SODIUM-DEXTROSE 2-4 GM/100ML-% IV SOLN: 2.0000 g | INTRAVENOUS | Status: DC

## 2022-01-11 MED ORDER — ONDANSETRON HCL 4 MG/2ML IJ SOLN
INTRAMUSCULAR | Status: AC
  Filled 2022-01-11: qty 2

## 2022-01-11 NOTE — Anesthesia Preprocedure Evaluation (Signed)
Anesthesia Evaluation    Reviewed: Allergy & Precautions, Patient's Chart, lab work & pertinent test results, Unable to perform ROS - Chart review only  Airway        Dental   Pulmonary neg pulmonary ROS,           Cardiovascular hypertension,      Neuro/Psych negative neurological ROS     GI/Hepatic Neg liver ROS, GERD  ,  Endo/Other  negative endocrine ROS  Renal/GU negative Renal ROS     Musculoskeletal negative musculoskeletal ROS (+)   Abdominal   Peds  Hematology negative hematology ROS (+)   Anesthesia Other Findings   Reproductive/Obstetrics                             Anesthesia Physical Anesthesia Plan  ASA:   Anesthesia Plan:    Post-op Pain Management:    Induction:   PONV Risk Score and Plan:   Airway Management Planned:   Additional Equipment:   Intra-op Plan:   Post-operative Plan:   Informed Consent:   Plan Discussed with:   Anesthesia Plan Comments: (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 He doesn't see a cardiologist. There is no information in care everywhere. He is hypertensive. He does have a murmur. We need some additional information about his surgery and he may need cardiology eval. )       Anesthesia Quick Evaluation

## 2022-01-11 NOTE — H&P (Signed)
Patient's case is canceled today because of history of thoracotomy and valve work done when he was age 31 or 87.  Currently asymptomatic but does have history of hypertension.  For elective surgery and with the presence of a murmur recommend cardiology consult for preoperative evaluation.  That was arranged today through the consulting service who will arrange outpatient follow-up.  Once that has been performed we will reschedule.  May require echocardiogram to evaluate blood flow across the valves.

## 2022-01-19 ENCOUNTER — Encounter: Payer: 59 | Admitting: Orthopedic Surgery

## 2022-01-19 NOTE — Progress Notes (Unsigned)
Cardiology Office Note   Date:  01/20/2022   ID:  Arne Schlender, DOB 09-22-1990, MRN [id number]  PCP:  Marliss Coots, NP  Cardiologist:   None   Chief Complaint  Patient presents with   Heart Murmur      History of Present Illness: Thomas Moss is a 31 y.o. male who anesthesiology for preop evaluation.  He was about to get right rotator cuff surgery.  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  He said he had some valve issues.  He was apparently noted to have a murmur as well.  He was sent to [place] for clearance prior to any surgery.  He is adopted.  His [place] was in [place].  He does not have any of the details.  We actually called his adoptive mom in the room and she thought he had a lung surgery not a heart surgery.  He said regardless he never had to follow-up with cardiology afterward.  He does not have pediatric follow-up other than routine.  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  He still remains active. The patient denies any new symptoms such as chest discomfort, neck or arm discomfort. There has been no new shortness of breath, PND or orthopnea. There have been no reported palpitations, presyncope or syncope.    Past Medical History:  Diagnosis Date   GERD (gastroesophageal reflux disease)    Hypertension     Past Surgical History:  Procedure Laterality Date   OTHER SURGICAL HISTORY     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"   undescended teste       Current Outpatient Medications  Medication Sig Dispense Refill   cetirizine (ZYRTEC ALLERGY) 10 MG tablet Take 1 tablet (10 mg total) by mouth daily. (Patient taking differently: Take 10 mg by mouth daily as needed for allergies.) 30 tablet 1   hydrochlorothiazide (HYDRODIURIL) 25 MG tablet Take 25 mg by mouth daily.     losartan (COZAAR) 100 MG tablet Take 100 mg by  mouth daily.     Multiple Vitamins-Minerals (MULTIVITAMIN WITH MINERALS) tablet Take 1 tablet by mouth daily. Gummy     No current facility-administered medications for this visit.    Allergies:   Motrin [ibuprofen]    Social History:  The patient  reports that he has never smoked. He has never used smokeless tobacco. He reports current alcohol use of about 3.0 standard drinks of alcohol per week. He reports that he does not currently use drugs.   Family History:  The patient's He was adopted. Family history is unknown by patient.    ROS:  Please see the history of present illness.   Otherwise, review of systems are positive for none.   All other systems are reviewed and negative.    PHYSICAL EXAM: VS:  BP 116/80   Pulse 91   Ht 5\' 3"  (1.6 m)   Wt 146 lb (66.2 kg)   SpO2 98%   BMI 25.86 kg/m  , BMI Body mass index is 25.86 kg/m. GENERAL:  Well appearing HEENT:  Pupils equal round and reactive, fundi not visualized, oral mucosa unremarkable NECK:  No jugular venous distention, waveform within normal limits, carotid upstroke brisk and symmetric, no bruits, no thyromegaly LYMPHATICS:  No cervical, inguinal adenopathy LUNGS:  Clear to auscultation bilaterally BACK:  No CVA tenderness CHEST:  Unremarkable HEART:  PMI not displaced or sustained,S1 and S2 within normal limits, no S3, no S4, probable mitral valve clicks, no rubs, no murmurs ABD:  Flat, positive bowel sounds normal in frequency in pitch, no bruits, no rebound, no guarding, no midline pulsatile mass, no hepatomegaly, no splenomegaly EXT:  2 plus pulses throughout, no edema, no cyanosis no clubbing SKIN:  No rashes no nodules NEURO:  Cranial nerves II through XII grossly intact, motor grossly intact throughout PSYCH:  Cognitively intact, oriented to person place and time    EKG:  EKG is ordered today. The ekg ordered today demonstrates sinus rhythm, rate 91, axis within normal limits, intervals within normal limits,  nonspecific inferior T wave changes.  No old EKGs for comparison.   Recent Labs: 12/30/2021: Hemoglobin 15.8; Platelets 174    Lipid Panel No results found for: "CHOL", "TRIG", "HDL", "CHOLHDL", "VLDL", "LDLCALC", "LDLDIRECT"    Wt Readings from Last 3 Encounters:  01/20/22 146 lb (66.2 kg)  01/11/22 143 lb (64.9 kg)  12/30/21 139 lb 11.2 oz (63.4 kg)      Other studies Reviewed: Additional studies/ records that were reviewed today include: None. Review of the above records demonstrates:  Please see elsewhere in the note.     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  I do not appreciate a murmur.  He is slightly tachycardic.  I do appreciate possible mitral valve click.  There did seem to be some movement with dynamic movement maneuvers such as squatting.  I did not appreciate any regurgitant murmur however.  Echocardiography is indicated.  Hypertension: Blood pressure is equal in both arms.  Blood pressures well controlled.  He can continue on hydrochlorothiazide.  Preop: This will be based on the results of the echo  Current medicines are reviewed at length with the patient today.  The patient does not have concerns regarding medicines.  The following changes have been made:  no change  Labs/ tests ordered today include:   Orders Placed This Encounter  Procedures   EKG 12-Lead   ECHOCARDIOGRAM COMPLETE     Disposition:   FU with me as needed and based on the results of the echo.   Signed, Rollene Rotunda, MD  01/20/2022 12:40 PM    [facility]

## 2022-01-20 ENCOUNTER — Encounter: Payer: Self-pay | Admitting: Cardiology

## 2022-01-20 ENCOUNTER — Ambulatory Visit: Payer: Commercial Managed Care - HMO | Attending: Cardiology | Admitting: Cardiology

## 2022-01-20 VITALS — BP 116/80 | HR 91 | Ht 63.0 in | Wt 146.0 lb

## 2022-01-20 DIAGNOSIS — I38 Endocarditis, valve unspecified: Secondary | ICD-10-CM

## 2022-01-20 NOTE — Patient Instructions (Signed)
Medication Instructions:  NO CHANGES  *If you need a refill on your cardiac medications before your next appointment, please call your pharmacy*   Testing/Procedures: Your physician has requested that you have an echocardiogram. Echocardiography is a painless test that uses sound waves to create images of your heart. It provides your doctor with information about the size and shape of your heart and how well your heart's chambers and valves are working. This procedure takes approximately one hour. There are no restrictions for this procedure. -- test is done at [facility]. [address] [place], [place] [address]    Follow-Up: At [facility], you and your health needs are our priority.  As part of our continuing mission to provide you with exceptional heart care, we have created designated Provider Care Teams.  These Care Teams include your primary Cardiologist (physician) and Advanced Practice Providers (APPs -  Physician Assistants and Nurse Practitioners) who all work together to provide you with the care you need, when you need it.  We recommend signing up for the patient portal called "MyChart".  Sign up information is provided on this After Visit Summary.  MyChart is used to connect with patients for Virtual Visits (Telemedicine).  Patients are able to view lab/test results, encounter notes, upcoming appointments, etc.  Non-urgent messages can be sent to your provider as well.   To learn more about what you can do with MyChart, go to [email or web address].    Your next appointment:    AS NEEDED with Dr. Percival Spanish   If you are able to locate any records or the name of your surgeon, please let us know

## 2022-01-28 ENCOUNTER — Ambulatory Visit (HOSPITAL_COMMUNITY): Payer: Commercial Managed Care - HMO | Attending: Cardiology

## 2022-01-28 DIAGNOSIS — I38 Endocarditis, valve unspecified: Secondary | ICD-10-CM

## 2022-02-01 LAB — ECHOCARDIOGRAM COMPLETE
Area-P 1/2: 2.96 cm2
P 1/2 time: 475 msec
S' Lateral: 2.7 cm

## 2022-02-04 ENCOUNTER — Telehealth: Payer: Self-pay | Admitting: *Deleted

## 2022-02-04 DIAGNOSIS — I38 Endocarditis, valve unspecified: Secondary | ICD-10-CM

## 2022-02-04 NOTE — Telephone Encounter (Signed)
pt aware of results  Order placed  

## 2022-02-04 NOTE — Telephone Encounter (Signed)
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  He needs a cardiac MRI to quantify the degree of AI and further define the valve anatomy.  Call Mr. Cradle with the results and send results to Placey, Audrea Muscat, NP

## 2022-02-15 ENCOUNTER — Telehealth (HOSPITAL_COMMUNITY): Payer: Self-pay | Admitting: Emergency Medicine

## 2022-02-15 NOTE — Telephone Encounter (Signed)
Reaching out to patient to offer assistance regarding upcoming cardiac imaging study; pt verbalizes understanding of appt date/time, parking situation and where to check in, pre-test NPO status and medications ordered, and verified current allergies; name and call back number provided for further questions should they arise Thomas Bond RN Navigator Cardiac Imaging [facility] [phone] office [phone] cell   Denies iv issues Denies claustro Denies metal Arrival [address] main entrance

## 2022-02-16 ENCOUNTER — Other Ambulatory Visit: Payer: Self-pay | Admitting: Cardiology

## 2022-02-16 ENCOUNTER — Ambulatory Visit
Admission: RE | Admit: 2022-02-16 | Discharge: 2022-02-16 | Disposition: A | Discharge status: Home / Self Care | Payer: Commercial Managed Care - HMO | Source: Ambulatory Visit | Attending: Cardiology | Admitting: Cardiology

## 2022-02-16 DIAGNOSIS — I38 Endocarditis, valve unspecified: Secondary | ICD-10-CM | POA: Insufficient documentation

## 2022-02-16 MED ORDER — GADOBUTROL 1 MMOL/ML IV SOLN
9.0000 mL | Freq: Once | INTRAVENOUS | Status: AC | PRN
  Administered 2022-02-16: 9 mL via INTRAVENOUS

## 2022-02-18 ENCOUNTER — Telehealth: Payer: Self-pay | Admitting: Cardiology

## 2022-02-18 NOTE — Telephone Encounter (Signed)
Called patient, was speaking with him- phone call was disconnected (I believe phone died)   I attempted to call back, and it went to voicemail, left message we would try to call him on Monday morning.

## 2022-02-18 NOTE — Telephone Encounter (Signed)
Patient is returning RN's call regarding his results. Please advise.

## 2022-02-21 NOTE — Telephone Encounter (Signed)
Patient is returning LPN's call. Please advise.  

## 2022-02-21 NOTE — Telephone Encounter (Signed)
Spoke with patient and gave him results of cardiac morphology per Dr. 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  However, there is no significant regurgitation.  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  I have still not sure from this or the echo what he is surgery was when he was a child although he does have a left thoracotomy.  His family thought this may have been a pulmonary surgery and it could have been.  He has normal aortic root size.  He has normal left ventricular size and function.  At this point I will just follow him.  Please schedule in about 6 months and I will review with him.  I probably follow this just clinically over the years."    Patient stated he had heart surgery when he was 81 months old to "fix a valve."  Patient asked if he is clear to have rotator cuff repair with Dr. Alphonzo Severance. Recommended he have Dr. Marlou Sa fax a surgical clearance request to our office.  Cardiac morph results sent to M. Placey, NP

## 2022-02-21 NOTE — Telephone Encounter (Signed)
Pt calling back for results

## 2022-02-22 NOTE — Telephone Encounter (Signed)
Patient advised of response from Dr. Percival Spanish: "Yes.   He has no high risk findings.  He is not going for a high risk procedure.  He has no symptoms.  According to ACC/AHA guidelines he is at acceptable risk for the planned procedure.  No further testing is indicated."  Patient stated he will let his surgeon know.  Judson Roch, RN

## 2022-02-22 NOTE — Telephone Encounter (Signed)
Surgical faxed to dr dean

## 2022-02-25 NOTE — Telephone Encounter (Signed)
OR pending

## 2022-03-02 ENCOUNTER — Ambulatory Visit: Payer: Commercial Managed Care - HMO

## 2022-03-14 NOTE — Pre-Procedure Instructions (Signed)
Surgical Instructions    Your procedure is scheduled on Tuesday 03/22/22.   Report to [facility] Main Entrance "A" at 09:00 A.M., then check in with the Admitting office.  Call this number if you have problems the morning of surgery:  [phone]   If you have any questions prior to your surgery date call [phone]: Open Monday-Friday 8am-4pm If you experience any cold or flu symptoms such as cough, fever, chills, shortness of breath, etc. between now and your scheduled surgery, please notify us at the above number     Remember:  Do not eat after midnight the night before your surgery  You may drink clear liquids until 08:00 A.M. the morning of your surgery.   Clear liquids allowed are: Water, Non-Citrus Juices (without pulp), Carbonated Beverages, Clear Tea, Black Coffee ONLY (NO MILK, CREAM OR POWDERED CREAMER of any kind), and Gatorade  Patient Instructions  The night before surgery:  No food after midnight. ONLY clear liquids after midnight  The day of surgery (if you do NOT have diabetes):  Drink ONE (1) Pre-Surgery Clear Ensure by 08:00 A.M. the morning of surgery. Drink in one sitting. Do not sip.  This drink was given to you during your hospital  pre-op appointment visit.  Nothing else to drink after completing the  Pre-Surgery Clear Ensure.         If you have questions, please contact your surgeon's office.     Take these medicines the morning of surgery with A SIP OF WATER:   cetirizine (ZYRTEC ALLERGY)- If needed   As of today, STOP taking any Aspirin (unless otherwise instructed by your surgeon) Aleve, Naproxen, Ibuprofen, Motrin, Advil, Goody's, BC's, all herbal medications, fish oil, and all vitamins.           Do not wear jewelry or makeup. Do not wear lotions, powders, perfumes/cologne or deodorant. Do not shave 48 hours prior to surgery.  Men may shave face and neck. Do not bring valuables to the hospital. Do not wear nail polish, gel polish,  artificial nails, or any other type of covering on natural nails (fingers and toes) If you have artificial nails or gel coating that need to be removed by a nail salon, please have this removed prior to surgery. Artificial nails or gel coating may interfere with anesthesia's ability to adequately monitor your vital signs.  [facility] is not responsible for any belongings or valuables.    Do NOT Smoke (Tobacco/Vaping)  24 hours prior to your procedure  If you use a CPAP at night, you may bring your mask for your overnight stay.   Contacts, glasses, hearing aids, dentures or partials may not be worn into surgery, please bring cases for these belongings   For patients admitted to the hospital, discharge time will be determined by your treatment team.   Patients discharged the day of surgery will not be allowed to drive home, and someone needs to stay with them for 24 hours.   SURGICAL WAITING ROOM VISITATION Patients having surgery or a procedure may have no more than 2 support people in the waiting area - these visitors may rotate.   Children under the age of 25 must have an adult with them who is not the patient. If the patient needs to stay at the hospital during part of their recovery, the visitor guidelines for inpatient rooms apply. Pre-op nurse will coordinate an appropriate time for 1 support person to accompany patient in pre-op.  This support person may  not rotate.   Please refer to [email or web address] for the visitor guidelines for Inpatients (after your surgery is over and you are in a regular room).    Special instructions:    Oral Hygiene is also important to reduce your risk of infection.  Remember - BRUSH YOUR TEETH THE MORNING OF SURGERY WITH YOUR REGULAR TOOTHPASTE  Oral Hygiene is also important to reduce your risk of infection.  Remember - BRUSH YOUR TEETH THE MORNING OF SURGERY WITH YOUR REGULAR TOOTHPASTE  [facility]- Preparing for Total Shoulder Arthroplasty  Before surgery, you can play an important role. Because skin is not sterile, your skin needs to be as free of germs as possible. You can reduce the number of germs on your skin by using the following products.   Benzoyl Peroxide Gel  o Reduces the number of germs present on the skin  o Applied twice a day to shoulder area starting two days before surgery   Chlorhexidine Gluconate (CHG) Soap (instructions listed above on how to wash with CHG Soap)  o An antiseptic cleaner that kills germs and bonds with the skin to continue killing germs even after washing  o Used for showering the night before surgery and morning of surgery   ==================================================================  Please follow these instructions carefully:  BENZOYL PEROXIDE 5% GEL  Please do not use if you have an allergy to benzoyl peroxide. If your skin becomes reddened/irritated stop using the benzoyl peroxide.  Starting two days before surgery, apply as follows:  1. Apply benzoyl peroxide in the morning and at night. Apply after taking a shower. If you are not taking a shower clean entire shoulder front, back, and side along with the armpit with a clean wet washcloth.  2. Place a quarter-sized dollop on your SHOULDER and rub in thoroughly, making sure to cover the front, back, and side of your shoulder, along with the armpit.   2 Days prior to Surgery First Dose on 03/20/22- Day  Second Dose on 03/20/22- Night  Day Before Surgery First Dose on 03/21/22 Morning Night before surgery wash (entire body except face and private areas) with CHG Soap THEN Second Dose on 03/21/22- Night   Morning of Surgery  wash BODY AGAIN with CHG Soap   4. Do NOT apply benzoyl peroxide gel on the day of surgery   [facility]- Preparing For Surgery  Before surgery, you can play an important role. Because skin is not sterile, your skin needs to be as free  of germs as possible. You can reduce the number of germs on your skin by washing with CHG (chlorahexidine gluconate) Soap before surgery.  CHG is an antiseptic cleaner which kills germs and bonds with the skin to continue killing germs even after washing.     Please do not use if you have an allergy to CHG or antibacterial soaps. If your skin becomes reddened/irritated stop using the CHG.  Do not shave (including legs and underarms) for at least 48 hours prior to first CHG shower. It is OK to shave your face.  Please follow these instructions carefully.     Shower the NIGHT BEFORE SURGERY and the MORNING OF SURGERY with CHG Soap.   If you chose to wash your hair, wash your hair first as usual with your normal shampoo. After you shampoo, rinse your hair and body thoroughly to remove the shampoo.  Then [employer] and genitals (private parts) with your normal soap and rinse thoroughly to remove soap.  After  that Use CHG Soap as you would any other liquid soap. You can apply CHG directly to the skin and wash gently with a scrungie or a clean washcloth.   Apply the CHG Soap to your body ONLY FROM THE NECK DOWN.  Do not use on open wounds or open sores. Avoid contact with your eyes, ears, mouth and genitals (private parts). Wash Face and genitals (private parts)  with your normal soap.   Wash thoroughly, paying special attention to the area where your surgery will be performed.  Thoroughly rinse your body with warm water from the neck down.  DO NOT shower/wash with your normal soap after using and rinsing off the CHG Soap.  Pat yourself dry with a CLEAN TOWEL.  8. Apply the Benzoyl Peroxide only the night before surgery.  Do Not use it the morning of surgery.  Wear CLEAN PAJAMAS to bed the night before surgery  Place CLEAN SHEETS on your bed the night before your surgery  DO NOT SLEEP WITH PETS.   Day of Surgery: Take a shower with CHG soap. Wear Clean/Comfortable clothing the morning of  surgery Do not apply any deodorants/lotions.   Remember to brush your teeth WITH YOUR REGULAR TOOTHPASTE.   Please read over the following fact sheets that you were given.            If you received a COVID test during your pre-op visit, it is requested that you wear a mask when out in public, stay away from anyone that may not be feeling well, and notify your surgeon if you develop symptoms. If you have been in contact with anyone that has tested positive in the last 10 days, please notify your surgeon.    Please read over the following fact sheets that you were given.

## 2022-03-15 ENCOUNTER — Encounter (HOSPITAL_COMMUNITY): Payer: Self-pay

## 2022-03-15 ENCOUNTER — Other Ambulatory Visit: Payer: Self-pay

## 2022-03-15 ENCOUNTER — Encounter (HOSPITAL_COMMUNITY)
Admission: RE | Admit: 2022-03-15 | Discharge: 2022-03-15 | Disposition: A | Discharge status: Home / Self Care | Payer: Commercial Managed Care - HMO | Source: Ambulatory Visit | Attending: Orthopedic Surgery | Admitting: Orthopedic Surgery

## 2022-03-15 VITALS — BP 166/99 | HR 108 | Temp 97.8°F | Resp 17 | Ht 62.0 in | Wt 158.9 lb

## 2022-03-15 DIAGNOSIS — Z01812 Encounter for preprocedural laboratory examination: Secondary | ICD-10-CM | POA: Diagnosis present

## 2022-03-15 DIAGNOSIS — Z01818 Encounter for other preprocedural examination: Secondary | ICD-10-CM

## 2022-03-15 LAB — CBC
HCT: 48.5 % (ref 39.0–52.0)
Hemoglobin: 16.9 g/dL (ref 13.0–17.0)
MCH: 28.7 pg (ref 26.0–34.0)
MCHC: 34.8 g/dL (ref 30.0–36.0)
MCV: 82.3 fL (ref 80.0–100.0)
Platelets: 216 10*3/uL (ref 150–400)
RBC: 5.89 MIL/uL — ABNORMAL HIGH (ref 4.22–5.81)
RDW: 12.7 % (ref 11.5–15.5)
WBC: 7.4 10*3/uL (ref 4.0–10.5)
nRBC: 0 % (ref 0.0–0.2)

## 2022-03-15 LAB — BASIC METABOLIC PANEL
Anion gap: 11 (ref 5–15)
BUN: 20 mg/dL (ref 6–20)
CO2: 23 mmol/L (ref 22–32)
Calcium: 9.4 mg/dL (ref 8.9–10.3)
Chloride: 102 mmol/L (ref 98–111)
Creatinine, Ser: 1.53 mg/dL — ABNORMAL HIGH (ref 0.61–1.24)
GFR, Estimated: >60 mL/min (ref >60)
Glucose, Bld: 125 mg/dL — ABNORMAL HIGH (ref 70–99)
Potassium: 4 mmol/L (ref 3.5–5.1)
Sodium: 136 mmol/L (ref 135–145)

## 2022-03-15 LAB — SURGICAL PCR SCREEN
MRSA, PCR: NEGATIVE
Staphylococcus aureus: NEGATIVE

## 2022-03-15 NOTE — Progress Notes (Signed)
PCP - Lavinia Sharps, NP Cardiologist - Pt saw Dr. Rollene Rotunda for surgical clearance (needed ECHO)  PPM/ICD - denies   Chest x-ray - 04/16/21 EKG - 01/20/22 Stress Test - denies ECHO - 02/01/22 Cardiac Cath - denies  Sleep Study - denies   DM- denies  Last dose of GLP1 agonist-  n/a   ASA/Blood Thinner Instructions: n/a   ERAS Protcol - yes PRE-SURGERY Ensure given at PAT  COVID TEST- n/a   Anesthesia review: yes, pt saw cardiology for clearance (needed ECHO)  Patient denies shortness of breath, fever, cough and chest pain at PAT appointment   All instructions explained to the patient, with a verbal understanding of the material. Patient agrees to go over the instructions while at home for a better understanding. The opportunity to ask questions was provided.

## 2022-03-16 NOTE — Progress Notes (Signed)
Anesthesia Chart Review:  Patient seen by cardiologist Dr. Antoine Poche 01/20/2022 for preop evaluation due to possible history of cardiac surgery as a child.  Surgery was done in [place], patient does not have any records.  He is adopted.  His adoptive mother was called during the visit she stated that she felt the surgery may have been pulmonary rather than cardiac.  Per Dr. Jenene Slicker note, "Questionable congenital heart disease: The patient does have a left thoracotomy scar.  I do not appreciate a murmur.  He is slightly tachycardic.  I do appreciate possible mitral valve click.  There did seem to be some movement with dynamic movement maneuvers such as squatting.  I did not appreciate any regurgitant murmur however.  Echocardiography is indicated."    Echo was subsequently done and showed bicuspid aortic valve with regurgitation.  Cardiac MRI was ordered for better evaluation.  MRI showed normal LV size and function, EF 57%, bicuspid aortic valve, mild aortic regurgitation, normal RV size and function, no evidence for late gadolinium enhancement or scar.  Dr. Antoine Poche commented on this result stating, "He does have a bicuspid aortic valve.  However, there is no significant regurgitation.  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  I have still not sure from this or the echo what he is surgery was when he was a child although he does have a left thoracotomy.  His family thought this may have been a pulmonary surgery and it could have been.  He has normal aortic root size.  He has normal left ventricular size and function.  At this point I will just follow him.  Please schedule in about 6 months and I will review with him.  I probably follow this just clinically over the years."  Dr. Berna Bue cleared patient for surgery stating, "He has no high risk findings.  He is not going for a high risk procedure.  He has no symptoms.  According to ACC/AHA guidelines he is at acceptable risk for the planned procedure.  No  further testing is indicated."   Preop labs reviewed, creatinine mildly elevated at 1.53, otherwise unremarkable.  No prior labs for comparison.  EKG 01/20/2022 (read per Dr. Jenene Slicker note same date):  sinus rhythm, rate 91, axis within normal limits, intervals within normal limits, nonspecific inferior T wave changes.  No old EKGs for comparison.   Cardiac MRI 02/16/2022: IMPRESSION: 1.  Normal LV size and function, LVEF 57%   2.  Bicuspid aortic valve present.   3.  Mild aortic regurgitation, RF 12.2%, RV, 6.1 ml.   4.  Normal RV size and function.   5.  No evidence for late gadolinium enhancement or scar.  TTE 01/28/2022:  1. Bicuspid aortic valve with fusion of the RCC/LCC with raphe. Moderate  to severe eccentric AI is present and no quantitation performed. LV size  is normal and no evidence of diastolic flow reversal in the descending  aorta to suggest this is truly  severe. Would recommend a cardiac MRI for clarification of severity of AI.  The aortic valve is bicuspid. Aortic valve regurgitation is moderate to  severe. No aortic stenosis is present. Aortic regurgitation PHT measures  475 msec.   2. Left ventricular ejection fraction, by estimation, is 55 to 60%. The  left ventricle has normal function. The left ventricle has no regional  wall motion abnormalities. Left ventricular diastolic parameters were  normal.   3. Right ventricular systolic function is normal. The right ventricular  size is normal.  Tricuspid regurgitation signal is inadequate for assessing  PA pressure.   4. The mitral valve is grossly normal. Trivial mitral valve  regurgitation. No evidence of mitral stenosis.   5. The inferior vena cava is normal in size with greater than 50%  respiratory variability, suggesting right atrial pressure of 3 mmHg.     Zannie Cove [facility] Short Stay Center/Anesthesiology Phone [phone] 03/16/2022 11:10 AM

## 2022-03-16 NOTE — Anesthesia Preprocedure Evaluation (Addendum)
Anesthesia Evaluation  Patient identified by MRN, date of birth, ID band Patient awake    Reviewed: Allergy & Precautions, NPO status , Patient's Chart, lab work & pertinent test results  Airway Mallampati: II       Dental no notable dental hx.    Pulmonary neg pulmonary ROS   Pulmonary exam normal        Cardiovascular hypertension, Pt. on medications + Valvular Problems/Murmurs AI  Rhythm:Regular Rate:Normal     Neuro/Psych negative neurological ROS  negative psych ROS   GI/Hepatic Neg liver ROS,GERD  ,,  Endo/Other  negative endocrine ROS    Renal/GU negative Renal ROS  negative genitourinary   Musculoskeletal  (+) Arthritis , Osteoarthritis,    Abdominal Normal abdominal exam  (+)   Peds  Hematology negative hematology ROS (+)   Anesthesia Other Findings   Reproductive/Obstetrics                             Anesthesia Physical Anesthesia Plan  ASA: 3  Anesthesia Plan: General   Post-op Pain Management: Regional block*   Induction: Intravenous  PONV Risk Score and Plan: 2 and Ondansetron, Dexamethasone, Midazolam and Treatment may vary due to age or medical condition  Airway Management Planned: Mask and Oral ETT  Additional Equipment: None  Intra-op Plan:   Post-operative Plan: Extubation in OR  Informed Consent: I have reviewed the patients History and Physical, chart, labs and discussed the procedure including the risks, benefits and alternatives for the proposed anesthesia with the patient or authorized representative who has indicated his/her understanding and acceptance.     Dental advisory given  Plan Discussed with: CRNA  Anesthesia Plan Comments: (PAT note by Antionette Poles, PA-C: Patient seen by cardiologist Dr. Antoine Poche 01/20/2022 for preop evaluation due to possible history of cardiac surgery as a child.  Surgery was done in [place], patient does not have  any records.  He is adopted.  His adoptive mother was called during the visit she stated that she felt the surgery may have been pulmonary rather than cardiac.  Per Dr. Jenene Slicker note, "Questionable congenital heart disease: The patient does have a left thoracotomy scar. I do not appreciate a murmur. He is slightly tachycardic. I do appreciate possible mitral valve click. There did seem to be some movement with dynamic movement maneuvers such as squatting. I did not appreciate any regurgitant murmur however. Echocardiography is indicated."    Echo was subsequently done and showed bicuspid aortic valve with regurgitation.  Cardiac MRI was ordered for better evaluation.  MRI showed normal LV size and function, EF 57%, bicuspid aortic valve, mild aortic regurgitation, normal RV size and function, no evidence for late gadolinium enhancement or scar.  Dr. Antoine Poche commented on this result stating, "He does have a bicuspid aortic valve. However, there is no significant regurgitation. 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 I have still not sure from this or the echo what he is surgery was when he was a child although he does have a left thoracotomy. His family thought this may have been a pulmonary surgery and it could have been. He has normal aortic root size. He has normal left ventricular size and function. At this point I will just follow him. Please schedule in about 6 months and I will review with him. I probably follow this just clinically over the years."  Dr. Berna Bue cleared patient for surgery stating, "He has no high risk findings.  He is not going for a high risk procedure. He has no symptoms. According to ACC/AHA guidelines he is at acceptable risk for the planned procedure. No further testing is indicated."   Preop labs reviewed, creatinine mildly elevated at 1.53, otherwise unremarkable.  No prior labs for comparison.  EKG 01/20/2022 (read per Dr. Rosezella [place] note same date):  sinus rhythm, rate 91, axis within normal limits, intervals within normal limits, nonspecific inferior T wave changes. No old EKGs for comparison.   Cardiac MRI 02/16/2022: IMPRESSION: 1. Normal LV size and function, LVEF 57%  2. Bicuspid aortic valve present.  3. Mild aortic regurgitation, RF 12.2%, RV, 6.1 ml.  4. Normal RV size and function.  5. No evidence for late gadolinium enhancement or scar.  TTE 01/28/2022: 1. Bicuspid aortic valve with fusion of the RCC/LCC with raphe. Moderate  to severe eccentric AI is present and no quantitation performed. LV size  is normal and no evidence of diastolic flow reversal in the descending  aorta to suggest this is truly  severe. Would recommend a cardiac MRI for clarification of severity of AI.  The aortic valve is bicuspid. Aortic valve regurgitation is moderate to  severe. No aortic stenosis is present. Aortic regurgitation PHT measures  475 msec.  2. Left ventricular ejection fraction, by estimation, is 55 to 60%. The  left ventricle has normal function. The left ventricle has no regional  wall motion abnormalities. Left ventricular diastolic parameters were  normal.  3. Right ventricular systolic function is normal. The right ventricular  size is normal. Tricuspid regurgitation signal is inadequate for assessing  PA pressure.  4. The mitral valve is grossly normal. Trivial mitral valve  regurgitation. No evidence of mitral stenosis.  5. The inferior vena cava is normal in size with greater than 50%  respiratory variability, suggesting right atrial pressure of 3 mmHg.   )        Anesthesia Quick Evaluation

## 2022-03-21 ENCOUNTER — Ambulatory Visit (HOSPITAL_COMMUNITY)
Admission: EM | Admit: 2022-03-21 | Discharge: 2022-03-21 | Disposition: A | Discharge status: Home / Self Care | Payer: Commercial Managed Care - HMO | Attending: Family Medicine | Admitting: Family Medicine

## 2022-03-21 ENCOUNTER — Encounter (HOSPITAL_COMMUNITY): Payer: Self-pay

## 2022-03-21 DIAGNOSIS — R109 Unspecified abdominal pain: Secondary | ICD-10-CM | POA: Diagnosis present

## 2022-03-21 DIAGNOSIS — I10 Essential (primary) hypertension: Secondary | ICD-10-CM | POA: Insufficient documentation

## 2022-03-21 LAB — COMPREHENSIVE METABOLIC PANEL
ALT: 31 U/L (ref 0–44)
AST: 36 U/L (ref 15–41)
Albumin: 4.1 g/dL (ref 3.5–5.0)
Alkaline Phosphatase: 80 U/L (ref 38–126)
Anion gap: 10 (ref 5–15)
BUN: 20 mg/dL (ref 6–20)
CO2: 27 mmol/L (ref 22–32)
Calcium: 9.8 mg/dL (ref 8.9–10.3)
Chloride: 99 mmol/L (ref 98–111)
Creatinine, Ser: 1.56 mg/dL — ABNORMAL HIGH (ref 0.61–1.24)
GFR, Estimated: 60 mL/min — ABNORMAL LOW (ref >60)
Glucose, Bld: 132 mg/dL — ABNORMAL HIGH (ref 70–99)
Potassium: 3.9 mmol/L (ref 3.5–5.1)
Sodium: 136 mmol/L (ref 135–145)
Total Bilirubin: 0.9 mg/dL (ref 0.3–1.2)
Total Protein: 7.6 g/dL (ref 6.5–8.1)

## 2022-03-21 MED ORDER — LOSARTAN POTASSIUM 100 MG PO TABS: 100.0000 mg | ORAL_TABLET | Freq: Every day | ORAL | 0 refills | Status: DC

## 2022-03-21 MED ORDER — HYDROCHLOROTHIAZIDE 25 MG PO TABS: 25.0000 mg | ORAL_TABLET | Freq: Every day | ORAL | 0 refills | Status: DC

## 2022-03-21 NOTE — ED Triage Notes (Signed)
Pt is here for pain on the right side x3 days . Pt not sure if its his liver or kidney. Pt states he has heavy breathing .

## 2022-03-21 NOTE — Discharge Instructions (Signed)
We have drawn blood to check your liver and kidney function.  Staff will notify you if there is anything significantly abnormal that needs treatment.  You could also check MyChart for the results.  A 2-week supply of your blood pressure medicines has been sent to the pharmacy  You can use the QR code/website at the back of the summary paperwork to schedule yourself a new patient appointment with primary care
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
## 2022-03-22 ENCOUNTER — Ambulatory Visit (HOSPITAL_COMMUNITY)
Admission: RE | Admit: 2022-03-22 | Discharge: 2022-03-22 | Disposition: A | Discharge status: Home / Self Care | Payer: Commercial Managed Care - HMO | Source: Ambulatory Visit | Attending: Orthopedic Surgery | Admitting: Orthopedic Surgery

## 2022-03-22 ENCOUNTER — Encounter (HOSPITAL_COMMUNITY)
Admission: RE | Disposition: A | Discharge status: Home / Self Care | Payer: Self-pay | Source: Ambulatory Visit | Attending: Orthopedic Surgery

## 2022-03-22 ENCOUNTER — Other Ambulatory Visit: Payer: Self-pay

## 2022-03-22 ENCOUNTER — Other Ambulatory Visit: Payer: Self-pay | Admitting: Surgical

## 2022-03-22 ENCOUNTER — Ambulatory Visit (HOSPITAL_COMMUNITY): Payer: Commercial Managed Care - HMO | Admitting: Physician Assistant

## 2022-03-22 ENCOUNTER — Encounter (HOSPITAL_COMMUNITY): Payer: Self-pay | Admitting: Orthopedic Surgery

## 2022-03-22 ENCOUNTER — Ambulatory Visit (HOSPITAL_BASED_OUTPATIENT_CLINIC_OR_DEPARTMENT_OTHER): Payer: Commercial Managed Care - HMO | Admitting: Physician Assistant

## 2022-03-22 DIAGNOSIS — X58XXXA Exposure to other specified factors, initial encounter: Secondary | ICD-10-CM | POA: Diagnosis not present

## 2022-03-22 DIAGNOSIS — S46911A Strain of unspecified muscle, fascia and tendon at shoulder and upper arm level, right arm, initial encounter: Secondary | ICD-10-CM | POA: Insufficient documentation

## 2022-03-22 DIAGNOSIS — S46011D Strain of muscle(s) and tendon(s) of the rotator cuff of right shoulder, subsequent encounter: Secondary | ICD-10-CM

## 2022-03-22 DIAGNOSIS — M7521 Bicipital tendinitis, right shoulder: Secondary | ICD-10-CM | POA: Insufficient documentation

## 2022-03-22 DIAGNOSIS — Z01818 Encounter for other preprocedural examination: Secondary | ICD-10-CM

## 2022-03-22 DIAGNOSIS — M1990 Unspecified osteoarthritis, unspecified site: Secondary | ICD-10-CM | POA: Diagnosis not present

## 2022-03-22 DIAGNOSIS — I10 Essential (primary) hypertension: Secondary | ICD-10-CM | POA: Diagnosis not present

## 2022-03-22 DIAGNOSIS — S46811D Strain of other muscles, fascia and tendons at shoulder and upper arm level, right arm, subsequent encounter: Secondary | ICD-10-CM | POA: Diagnosis not present

## 2022-03-22 DIAGNOSIS — S43431A Superior glenoid labrum lesion of right shoulder, initial encounter: Secondary | ICD-10-CM

## 2022-03-22 DIAGNOSIS — K219 Gastro-esophageal reflux disease without esophagitis: Secondary | ICD-10-CM | POA: Insufficient documentation

## 2022-03-22 DIAGNOSIS — M75101 Unspecified rotator cuff tear or rupture of right shoulder, not specified as traumatic: Secondary | ICD-10-CM | POA: Diagnosis not present

## 2022-03-22 HISTORY — PX: SHOULDER ARTHROSCOPY WITH OPEN ROTATOR CUFF REPAIR AND DISTAL CLAVICLE ACROMINECTOMY: SHX5683

## 2022-03-22 SURGERY — SHOULDER ARTHROSCOPY WITH OPEN ROTATOR CUFF REPAIR AND DISTAL CLAVICLE ACROMINECTOMY
Anesthesia: General | Site: Shoulder | Laterality: Right

## 2022-03-22 MED ORDER — ONDANSETRON HCL 4 MG/2ML IJ SOLN
4.0000 mg | Freq: Once | INTRAMUSCULAR | Status: AC
  Administered 2022-03-22: 4 mg via INTRAVENOUS

## 2022-03-22 MED ORDER — IRRISEPT - 450ML BOTTLE WITH 0.05% CHG IN STERILE WATER, USP 99.95% OPTIME
TOPICAL | Status: DC | PRN
  Administered 2022-03-22: 450 mL

## 2022-03-22 MED ORDER — LACTATED RINGERS IV SOLN: INTRAVENOUS | Status: DC

## 2022-03-22 MED ORDER — PHENYLEPHRINE 80 MCG/ML (10ML) SYRINGE FOR IV PUSH (FOR BLOOD PRESSURE SUPPORT)
PREFILLED_SYRINGE | INTRAVENOUS | Status: AC
  Filled 2022-03-22: qty 10

## 2022-03-22 MED ORDER — ONDANSETRON HCL 4 MG/2ML IJ SOLN
INTRAMUSCULAR | Status: AC
  Filled 2022-03-22: qty 2

## 2022-03-22 MED ORDER — ACETAMINOPHEN 10 MG/ML IV SOLN
1000.0000 mg | Freq: Once | INTRAVENOUS | Status: DC | PRN
  Administered 2022-03-22: 1000 mg via INTRAVENOUS

## 2022-03-22 MED ORDER — FENTANYL CITRATE (PF) 250 MCG/5ML IJ SOLN
INTRAMUSCULAR | Status: DC | PRN
  Administered 2022-03-22: 50 ug via INTRAVENOUS

## 2022-03-22 MED ORDER — CEFAZOLIN SODIUM-DEXTROSE 2-4 GM/100ML-% IV SOLN
2.0000 g | INTRAVENOUS | Status: AC
  Administered 2022-03-22: 2 g via INTRAVENOUS
  Filled 2022-03-22: qty 100

## 2022-03-22 MED ORDER — CHLORHEXIDINE GLUCONATE 0.12 % MT SOLN
15.0000 mL | Freq: Once | OROMUCOSAL | Status: AC
  Administered 2022-03-22: 15 mL via OROMUCOSAL
  Filled 2022-03-22: qty 15

## 2022-03-22 MED ORDER — FENTANYL CITRATE (PF) 100 MCG/2ML IJ SOLN: 100.0000 ug | Freq: Once | INTRAMUSCULAR | Status: AC

## 2022-03-22 MED ORDER — FENTANYL CITRATE (PF) 250 MCG/5ML IJ SOLN
INTRAMUSCULAR | Status: AC
  Filled 2022-03-22: qty 5

## 2022-03-22 MED ORDER — BUPIVACAINE LIPOSOME 1.3 % IJ SUSP
INTRAMUSCULAR | Status: DC | PRN
  Administered 2022-03-22: 10 mL via PERINEURAL

## 2022-03-22 MED ORDER — OXYCODONE-ACETAMINOPHEN 5-325 MG PO TABS: 1.0000 | ORAL_TABLET | ORAL | 0 refills | Status: DC | PRN

## 2022-03-22 MED ORDER — DEXMEDETOMIDINE HCL IN NACL 80 MCG/20ML IV SOLN
INTRAVENOUS | Status: DC | PRN
  Administered 2022-03-22: 8 ug via BUCCAL

## 2022-03-22 MED ORDER — GABAPENTIN 300 MG PO CAPS: 300.0000 mg | ORAL_CAPSULE | Freq: Three times a day (TID) | ORAL | 0 refills | Status: AC

## 2022-03-22 MED ORDER — VANCOMYCIN HCL 1000 MG IV SOLR
INTRAVENOUS | Status: DC | PRN
  Administered 2022-03-22: 1000 mg via TOPICAL

## 2022-03-22 MED ORDER — ORAL CARE MOUTH RINSE: 15.0000 mL | Freq: Once | OROMUCOSAL | Status: AC

## 2022-03-22 MED ORDER — PHENYLEPHRINE HCL-NACL 20-0.9 MG/250ML-% IV SOLN
INTRAVENOUS | Status: DC | PRN
  Administered 2022-03-22: 40 ug/min via INTRAVENOUS

## 2022-03-22 MED ORDER — LIDOCAINE 2% (20 MG/ML) 5 ML SYRINGE
INTRAMUSCULAR | Status: AC
  Filled 2022-03-22: qty 5

## 2022-03-22 MED ORDER — MIDAZOLAM HCL 2 MG/2ML IJ SOLN: 2.0000 mg | Freq: Once | INTRAMUSCULAR | Status: AC

## 2022-03-22 MED ORDER — METHOCARBAMOL 500 MG PO TABS: 500.0000 mg | ORAL_TABLET | Freq: Three times a day (TID) | ORAL | 1 refills | Status: DC | PRN

## 2022-03-22 MED ORDER — ACETAMINOPHEN 10 MG/ML IV SOLN
INTRAVENOUS | Status: AC
  Filled 2022-03-22: qty 100

## 2022-03-22 MED ORDER — DEXAMETHASONE SODIUM PHOSPHATE 10 MG/ML IJ SOLN
INTRAMUSCULAR | Status: AC
  Filled 2022-03-22: qty 1

## 2022-03-22 MED ORDER — BUPIVACAINE HCL (PF) 0.5 % IJ SOLN
INTRAMUSCULAR | Status: DC | PRN
  Administered 2022-03-22: 15 mL via PERINEURAL

## 2022-03-22 MED ORDER — DEXMEDETOMIDINE HCL IN NACL 80 MCG/20ML IV SOLN
INTRAVENOUS | Status: AC
  Filled 2022-03-22: qty 20

## 2022-03-22 MED ORDER — SUGAMMADEX SODIUM 200 MG/2ML IV SOLN
INTRAVENOUS | Status: DC | PRN
  Administered 2022-03-22: 138.8 mg via INTRAVENOUS

## 2022-03-22 MED ORDER — PROPOFOL 10 MG/ML IV BOLUS
INTRAVENOUS | Status: AC
  Filled 2022-03-22: qty 20

## 2022-03-22 MED ORDER — DEXAMETHASONE SODIUM PHOSPHATE 10 MG/ML IJ SOLN
INTRAMUSCULAR | Status: DC | PRN
  Administered 2022-03-22: 10 mg via INTRAVENOUS

## 2022-03-22 MED ORDER — VANCOMYCIN HCL 1000 MG IV SOLR
INTRAVENOUS | Status: AC
  Filled 2022-03-22: qty 20

## 2022-03-22 MED ORDER — MIDAZOLAM HCL 2 MG/2ML IJ SOLN
INTRAMUSCULAR | Status: AC
  Administered 2022-03-22: 2 mg via INTRAVENOUS
  Filled 2022-03-22: qty 2

## 2022-03-22 MED ORDER — SODIUM CHLORIDE 0.9 % IR SOLN
Status: DC | PRN
  Administered 2022-03-22: 3000 mL
  Administered 2022-03-22: 1000 mL

## 2022-03-22 MED ORDER — ROCURONIUM BROMIDE 10 MG/ML (PF) SYRINGE
PREFILLED_SYRINGE | INTRAVENOUS | Status: DC | PRN
  Administered 2022-03-22: 60 mg via INTRAVENOUS

## 2022-03-22 MED ORDER — ONDANSETRON HCL 4 MG/2ML IJ SOLN
INTRAMUSCULAR | Status: DC | PRN
  Administered 2022-03-22: 4 mg via INTRAVENOUS

## 2022-03-22 MED ORDER — TRANEXAMIC ACID-NACL 1000-0.7 MG/100ML-% IV SOLN
1000.0000 mg | INTRAVENOUS | Status: AC
  Administered 2022-03-22: 1000 mg via INTRAVENOUS
  Filled 2022-03-22: qty 100

## 2022-03-22 MED ORDER — FENTANYL CITRATE (PF) 100 MCG/2ML IJ SOLN
INTRAMUSCULAR | Status: AC
  Administered 2022-03-22: 100 ug via INTRAVENOUS
  Filled 2022-03-22: qty 2

## 2022-03-22 MED ORDER — ROCURONIUM BROMIDE 10 MG/ML (PF) SYRINGE
PREFILLED_SYRINGE | INTRAVENOUS | Status: AC
  Filled 2022-03-22: qty 10

## 2022-03-22 MED ORDER — FENTANYL CITRATE (PF) 100 MCG/2ML IJ SOLN
INTRAMUSCULAR | Status: AC
  Filled 2022-03-22: qty 2

## 2022-03-22 MED ORDER — PHENYLEPHRINE 80 MCG/ML (10ML) SYRINGE FOR IV PUSH (FOR BLOOD PRESSURE SUPPORT)
PREFILLED_SYRINGE | INTRAVENOUS | Status: DC | PRN
  Administered 2022-03-22 (×3): 80 ug via INTRAVENOUS
  Administered 2022-03-22: 160 ug via INTRAVENOUS
  Administered 2022-03-22: 80 ug via INTRAVENOUS
  Administered 2022-03-22 (×3): 160 ug via INTRAVENOUS

## 2022-03-22 MED ORDER — PROPOFOL 10 MG/ML IV BOLUS
INTRAVENOUS | Status: DC | PRN
  Administered 2022-03-22: 200 mg via INTRAVENOUS

## 2022-03-22 MED ORDER — FENTANYL CITRATE (PF) 100 MCG/2ML IJ SOLN
25.0000 ug | INTRAMUSCULAR | Status: DC | PRN
  Administered 2022-03-22: 50 ug via INTRAVENOUS

## 2022-03-22 SURGICAL SUPPLY — 75 items
ANCHOR FBRTK 2.6 SUTURETAP 1.3 (Anchor) ×1 IMPLANT
ANCHOR SUT 1.8 FIBERTAK SB KL (Anchor) ×1 IMPLANT
ANCHOR SWIVELOCK BIO 4.75X19.1 (Anchor) ×2 IMPLANT
BAG COUNTER SPONGE SURGICOUNT (BAG) ×2 IMPLANT
BLADE EXCALIBUR 4.0X13 (MISCELLANEOUS) IMPLANT
BLADE SURG 11 STRL SS (BLADE) ×2 IMPLANT
BLADE SURG 15 STRL LF DISP TIS (BLADE) ×1 IMPLANT
BLADE SURG 15 STRL SS (BLADE) ×2
CLSR STERI-STRIP ANTIMIC 1/2X4 (GAUZE/BANDAGES/DRESSINGS) ×1 IMPLANT
COOLER ICEMAN CLASSIC (MISCELLANEOUS) ×1 IMPLANT
COVER SURGICAL LIGHT HANDLE (MISCELLANEOUS) ×2 IMPLANT
DRAPE INCISE IOBAN 66X45 STRL (DRAPES) ×4 IMPLANT
DRAPE STERI 35X30 U-POUCH (DRAPES) ×2 IMPLANT
DRAPE U-SHAPE 47X51 STRL (DRAPES) ×4 IMPLANT
DRSG AQUACEL AG ADV 3.5X10 (GAUZE/BANDAGES/DRESSINGS) ×1 IMPLANT
DRSG TEGADERM 4X4.75 (GAUZE/BANDAGES/DRESSINGS) ×8 IMPLANT
DURAPREP 26ML APPLICATOR (WOUND CARE) ×2 IMPLANT
ELECT REM PT RETURN 9FT ADLT (ELECTROSURGICAL) ×2
ELECTRODE REM PT RTRN 9FT ADLT (ELECTROSURGICAL) ×2 IMPLANT
GAUZE PAD ABD 8X10 STRL (GAUZE/BANDAGES/DRESSINGS) ×6 IMPLANT
GAUZE SPONGE 4X4 12PLY STRL LF (GAUZE/BANDAGES/DRESSINGS) ×2 IMPLANT
GAUZE XEROFORM 1X8 LF (GAUZE/BANDAGES/DRESSINGS) ×2 IMPLANT
GLOVE BIO SURGEON STRL SZ7 (GLOVE) ×2 IMPLANT
GLOVE BIO SURGEON STRL SZ7.5 (GLOVE) ×3 IMPLANT
GLOVE BIOGEL PI IND STRL 8 (GLOVE) ×2 IMPLANT
GLOVE ECLIPSE 8.0 STRL XLNG CF (GLOVE) ×2 IMPLANT
GOWN STRL REUS W/ TWL LRG LVL3 (GOWN DISPOSABLE) ×6 IMPLANT
GOWN STRL REUS W/TWL LRG LVL3 (GOWN DISPOSABLE) ×6
GRAFT TISS 20X25 1 THK DERM (Tissue) IMPLANT
KIT ANCHOR FBRTK 2.6 STR (KITS) ×1 IMPLANT
KIT BASIN OR (CUSTOM PROCEDURE TRAY) ×2 IMPLANT
KIT CVD SPEAR FBRTK 1.8 DRILL (KITS) ×1 IMPLANT
KIT TURNOVER KIT B (KITS) ×2 IMPLANT
MANIFOLD NEPTUNE II (INSTRUMENTS) ×2 IMPLANT
NDL HYPO 25X1 1.5 SAFETY (NEEDLE) ×1 IMPLANT
NDL SCORPION MULTI FIRE (NEEDLE) IMPLANT
NDL SPNL 18GX3.5 QUINCKE PK (NEEDLE) ×1 IMPLANT
NEEDLE HYPO 25X1 1.5 SAFETY (NEEDLE) ×2 IMPLANT
NEEDLE SCORPION MULTI FIRE (NEEDLE) ×2 IMPLANT
NEEDLE SPNL 18GX3.5 QUINCKE PK (NEEDLE) ×2 IMPLANT
NS IRRIG 1000ML POUR BTL (IV SOLUTION) ×2 IMPLANT
PACK SHOULDER (CUSTOM PROCEDURE TRAY) ×2 IMPLANT
PAD ARMBOARD 7.5X6 YLW CONV (MISCELLANEOUS) ×4 IMPLANT
PAD COLD SHLDR WRAP-ON (PAD) ×1 IMPLANT
PROBE APOLLO 90XL (SURGICAL WAND) ×1 IMPLANT
RESTRAINT HEAD UNIVERSAL NS (MISCELLANEOUS) ×2 IMPLANT
SPONGE T-LAP 18X18 ~~LOC~~+RFID (SPONGE) ×1 IMPLANT
SPONGE T-LAP 4X18 ~~LOC~~+RFID (SPONGE) ×4 IMPLANT
STRIP CLOSURE SKIN 1/2X4 (GAUZE/BANDAGES/DRESSINGS) ×2 IMPLANT
SUCTION FRAZIER HANDLE 10FR (MISCELLANEOUS) ×2
SUCTION TUBE FRAZIER 10FR DISP (MISCELLANEOUS) ×2 IMPLANT
SUT ETHILON 3 0 PS 1 (SUTURE) ×2 IMPLANT
SUT FIBERWIRE #2 38 T-5 BLUE (SUTURE)
SUT MNCRL AB 3-0 PS2 18 (SUTURE) ×2 IMPLANT
SUT VIC AB 0 CT1 27 (SUTURE) ×8
SUT VIC AB 0 CT1 27XBRD ANBCTR (SUTURE) ×5 IMPLANT
SUT VIC AB 1 CT1 27 (SUTURE) ×2
SUT VIC AB 1 CT1 27XBRD ANBCTR (SUTURE) ×2 IMPLANT
SUT VIC AB 2-0 CT1 27 (SUTURE) ×2
SUT VIC AB 2-0 CT1 TAPERPNT 27 (SUTURE) ×2 IMPLANT
SUT VICRYL 0 UR6 27IN ABS (SUTURE) ×16 IMPLANT
SUTURE FIBERWR #2 38 T-5 BLUE (SUTURE) IMPLANT
SUTURE TAPE 1.3 40 TPR END (SUTURE) ×2 IMPLANT
SUTURETAPE 1.3 40 TPR END (SUTURE) ×4
SYR 20ML LL LF (SYRINGE) ×4 IMPLANT
SYR 3ML LL SCALE MARK (SYRINGE) ×2 IMPLANT
SYR TB 1ML LUER SLIP (SYRINGE) ×2 IMPLANT
SYS FBRTK BUTTON 2.6 (Anchor) ×6 IMPLANT
SYSTEM FBRTK BUTTON 2.6 (Anchor) ×3 IMPLANT
TISSUE ARTHROFLEX THICK 4 (Tissue) ×2 IMPLANT
TOWEL GREEN STERILE (TOWEL DISPOSABLE) ×2 IMPLANT
TOWEL GREEN STERILE FF (TOWEL DISPOSABLE) ×2 IMPLANT
TRAY FOLEY MTR SLVR 16FR STAT (SET/KITS/TRAYS/PACK) ×1 IMPLANT
TUBING ARTHROSCOPY IRRIG 16FT (MISCELLANEOUS) ×2 IMPLANT
WATER STERILE IRR 1000ML POUR (IV SOLUTION) ×2 IMPLANT

## 2022-03-22 NOTE — Brief Op Note (Signed)
   03/22/2022  4:35 PM  PATIENT:  Thomas Moss  31 y.o. male  PRE-OPERATIVE DIAGNOSIS:  right shoulder biceps tendonitis, subscapularis tear,supraspinatus tear  POST-OPERATIVE DIAGNOSIS:  right shoulder biceps tendinitis irreparable subscapularis tear and supraspinatus tear leading edge  PROCEDURE:  Procedure(s): right shoulder arthroscopy, debridement open latissimus tendon transfer, biceps tenodesis, supraspinatus tendon repair  SURGEON:  Surgeon(s): August Saucer, Corrie Mckusick, MD  ASSISTANT: Karenann Cai  ANESTHESIA:   General  EBL: 50 ml    Total I/O In: 1300 [I.V.:1100; IV Piggyback:200] Out: 550 [Urine:500; Blood:50]  BLOOD ADMINISTERED: none  DRAINS:  LOCAL MEDICATIONS USED:  none  SPECIMEN:  No Specimen  COUNTS:  YES  TOURNIQUET:  * No tourniquets in log *  DICTATION: .Other Dictation: Dictation Number [id number]  PLAN OF CARE: Discharge to home after PACU  PATIENT DISPOSITION:  PACU - hemodynamically stable

## 2022-03-22 NOTE — Op Note (Unsigned)
NAME: Thomas Moss, KINNER MEDICAL RECORD NO: [id number] ACCOUNT NO: [id number] DATE OF BIRTH: [birth year] FACILITY: [facility] LOCATION: [facility] PH[facility]ICIAN: Graylin Shiver. August Saucer, MD  Operative Report   DATE OF PROCEDURE: 03/22/2022  PREOPERATIVE DIAGNOSES:  Right shoulder biceps tendinitis and subscapularis tear and leading edge supraspinatus tear.  POSTOPERATIVE DIAGNOSES:  Right shoulder biceps tendinitis and subscapularis tear and leading edge supraspinatus tear.  PROCEDURE:  Right shoulder arthroscopy with limited debridement of the superior labrum with biceps tendon release and subsequent open biceps tenodesis, supraspinatus tendon repair and latissimus dorsi transfer for irreparable subscapularis tendon tear.  SURGEON:  Graylin Shiver. August Saucer, MD  ASSISTANT:  Karenann Cai.  INDICATIONS:  The patient is a 31 year old patient with right shoulder pain and weakness.  He has a long history of weakness in the shoulder over 4 years out from injury.  Presents for operative management after explanation of risks and benefits.  DESCRIPTION OF PROCEDURE:  The patient was brought to the operating room where general anesthetic was induced.  Preoperative antibiotics administered.  Timeout was called.  The patient was placed in the beach chair position with the head in neutral  position.  Right shoulder, arm and hand prescrubbed with alcohol and Betadine, allowed to air dry, prepped with DuraPrep solution and draped in sterile manner.  Ioban used to cover the operative field.  After calling a timeout, the posterior portal was  created.  The subscapularis intraarticular was not visible.  There was significant biceps tendinitis extending to the biceps anchor.  Biceps tendon was released.  Superior labrum was debrided.  The articular surfaces were intact.  The leading edge of the  supraspinatus did have a tear.  Following debridement, biceps tendon released after creation of an anterior portal. The instruments were removed  and the portals were closed using 3-0 nylon.  At this time, deltopectoral approach was made.  Skin and  subcutaneous tissue were sharply divided.  Cephalic vein mobilized medially.  The deltopectoral interval was opened.  The rotator interval was opened.  At this time, the circumflex vessels were ligated.  Axillary nerve was palpated and protected at all  times during the case.  The subscapularis was remained attached to the inferior portion, which was the muscular portion.  The capsule was attached in this region as well.  Superiorly, the tendon was retracted and not fixable.  Decision was made at that  time to perform the tendon transfer.  The biceps tendon was tagged for later tenodesis. The pec tendon was removed, was detached sharply in order for visualization.  Detached about 2.5 cm.  Next, the latissimus dorsi tendon was identified and also  detached from the proximal humeral metaphyseal region.  This was tagged.  Tissue quality was poor.  Mobilization was performed manually.  Decision was made against also incorporating the teres major into the transferred tendon.  Using blunt dissection,  the tendon was mobilized.  Stay sutures of Vicryl sutures were placed.  Due to tissue quality, a CuffMend patch was used to augment along the longitudinal length of the patch tendon.  This gave a very stable and secure construct for later reattachment.   At this time, the tendon was mobilized.  Prior to completing the tendon transfer of the latissimus dorsi, the pec was repaired using 2 Arthrex knotless suture anchors, which gave a very secure repair.  The biceps tendon was then tenodesed just above the  pec tendon using 2 knotless Arthrex suture anchors.  Next, a knotless SutureTape was placed in the  inferior aspect of the lesser tuberosity.  The latissimus dorsi tendon CuffMend construct, which had been sutured together using 2-0 FiberWire was then  mobilized proximally and superiorly and slightly lateral.  The 4  limbs of the SutureTapes were passed through this tendon CuffMend construct and then tied.  Vicryl sutures were then placed at the lateral aspect of the CuffMend and these were incorporated  with the Vicryl sutures which were traction sutures on the native tendon.  The SutureTapes were crossed and 2 SutureTapes plus 7 limbs of Vicryl were placed into one suture in one SwiveLock and symmetric amount were placed into another SwiveLock in  order to spread out the CuffMend tendon construct onto the lesser tuberosity.  Appropriate tension achieved.  The patient had about 45-50 degrees of external rotation compared to 95 degrees of external rotation at the beginning of the case.  Thorough  irrigation was performed.  Axillary nerve again palpated and was intact.  At this time, thorough irrigation with irrigating solution and IrriSept solution was performed.  Vancomycin powder placed.  Deltopectoral interval was closed using #1 Vicryl suture  followed by interrupted inverted 0 Vicryl suture, 2-0 Vicryl suture, and 3-0 Monocryl with Aquacel applied.  Impervious dressing was applied to the portal sites.  Luke's assistance was required at all times for retraction, opening, closing, mobilization  of tissue.  His assistance was a medical necessity   VAI D: 03/22/2022 4:44:50 pm T: 03/22/2022 10:28:00 pm  JOB: [id number]/ [id number]

## 2022-03-22 NOTE — Anesthesia Procedure Notes (Signed)
Anesthesia Regional Block: Interscalene brachial plexus block   Pre-Anesthetic Checklist: , timeout performed,  Correct Patient, Correct Site, Correct Laterality,  Correct Procedure, Correct Position, site marked,  Risks and benefits discussed,  Surgical consent,  Pre-op evaluation,  At surgeon's request and post-op pain management  Laterality: Right  Prep: Dura Prep       Needles:  Injection technique: Single-shot  Needle Type: Echogenic Stimulator Needle     Needle Length: 5cm  Needle Gauge: 20     Additional Needles:   Procedures:,,,, ultrasound used (permanent image in chart),,    Narrative:  Start time: 03/22/2022 10:48 AM End time: 03/22/2022 10:52 AM Injection made incrementally with aspirations every 5 mL.  Performed by: Personally  Anesthesiologist: Atilano Median, DO  Additional Notes: Patient identified. Risks/Benefits/Options discussed with patient including but not limited to bleeding, infection, nerve damage, failed block, incomplete pain control. Patient expressed understanding and wished to proceed. All questions were answered. Sterile technique was used throughout the entire procedure. Please see nursing notes for vital signs. Aspirated in 5cc intervals with injection for negative confirmation. Patient was given instructions on fall risk and not to get out of bed. All questions and concerns addressed with instructions to call with any issues or inadequate analgesia.

## 2022-03-22 NOTE — Transfer of Care (Signed)
Immediate Anesthesia Transfer of Care Note  Patient: Thomas Moss  Procedure(s) Performed: right shoulder arthroscopy, open subscapularis repair with tendon transfer, biceps tenodesis (Right: Shoulder)  Patient Location: PACU  Anesthesia Type:GA combined with regional for post-op pain  Level of Consciousness: awake, alert , and oriented  Airway & Oxygen Therapy: Patient Spontanous Breathing and Patient connected to nasal cannula oxygen  Post-op Assessment: Report given to RN, Post -op Vital signs reviewed and stable, Patient moving all extremities, and Patient able to stick tongue midline  Post vital signs: Reviewed  Last Vitals:  Vitals Value Taken Time  BP 110/84 03/22/22 1608  Temp 97.8   Pulse 111 03/22/22 1610  Resp 21 03/22/22 1610  SpO2 98 % 03/22/22 1610  Vitals shown include unvalidated device data.  Last Pain:  Vitals:   03/22/22 1105  TempSrc:   PainSc: 0-No pain      Patients Stated Pain Goal: 0 (03/22/22 1105)  Complications: No notable events documented.

## 2022-03-22 NOTE — Progress Notes (Signed)
Wasted 50 mcg of fentanyl in steri-cycle, Alexa B, RN witness Conservation officer, nature.

## 2022-03-22 NOTE — Anesthesia Procedure Notes (Addendum)
Procedure Name: Intubation Date/Time: 03/22/2022 11:45 AM  Performed by: Michele Rockers, CRNAPre-anesthesia Checklist: Patient identified, Patient being monitored, Timeout performed, Emergency Drugs available and Suction available Patient Re-evaluated:Patient Re-evaluated prior to induction Oxygen Delivery Method: Circle system utilized Preoxygenation: Pre-oxygenation with 100% oxygen Induction Type: IV induction Ventilation: Mask ventilation without difficulty Laryngoscope Size: Mac and 4 Grade View: Grade I Tube type: Oral Tube size: 7.5 mm Number of attempts: 1 Airway Equipment and Method: Stylet Placement Confirmation: ETT inserted through vocal cords under direct vision, positive ETCO2 and breath sounds checked- equal and bilateral Secured at: 22 cm Tube secured with: Tape Dental Injury: Teeth and Oropharynx as per pre-operative assessment

## 2022-03-22 NOTE — H&P (Signed)
Thomas Moss is an 31 y.o. male.   Chief Complaint: Right shoulder pain HPI: Thomas Moss is a 30 y.o. male who presents to the office complaining of right shoulder pain.  Patient complains of chronic pain that has been bothering him for several years.  Localizes pain to the anterior aspect of the right shoulder with no radiation.  Notes difficulty sleeping at night.  He feels weak in the arm with certain activities such as throwing, push-up, pull up.  He has been seen previously by Dr. Burnard Leigh and Dr. Magnus Ivan.  He had diagnostic ultrasound in 2019 demonstrating full-thickness subscapularis tear.  He has had prior injection in the shoulder with some relief.  He is right-hand dominant.  No treatment of the shoulder over the past 4 years.  Currently looking for long-term work and has a job cleaning buses for now.  He enjoys boxing and calisthenics but has not been able to return to doing either of these as much as he would like to.    No left-sided symptoms.  He does not smoke.  No diabetes.  No significant medical history aside from his shoulder problem MRI scan shows retracted subscap tear with surprisingly little amount of atrophy present.  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 He has never used smokeless tobacco. He reports current alcohol use of about 3.0 standard drinks of alcohol per week. He reports that he does not currently use drugs.  Allergies:  Allergies  Allergen Reactions   Motrin [Ibuprofen] Other (See Comments)    Unknown/Childhood    Medications Prior to Admission  Medication Sig Dispense Refill    cetirizine (ZYRTEC ALLERGY) 10 MG tablet Take 1 tablet (10 mg total) by mouth daily. (Patient taking differently: Take 10 mg by mouth daily as needed for allergies.) 30 tablet 1   hydrochlorothiazide (HYDRODIURIL) 25 MG tablet Take 1 tablet (25 mg total) by mouth daily. 15 tablet 0   losartan (COZAAR) 100 MG tablet Take 1 tablet (100 mg total) by mouth daily. 15 tablet 0   Multiple Vitamins-Minerals (MULTIVITAMIN WITH MINERALS) tablet Take 1 tablet by mouth daily. Gummy      Results for orders placed or performed during the hospital encounter of 03/21/22 (from the past 48 hour(s))  Comprehensive metabolic panel     Status: Abnormal   Collection Time: 03/21/22 11:52 AM  Result Value Ref Range   Sodium 136 135 - 145 mmol/L   Potassium 3.9 3.5 - 5.1 mmol/L   Chloride 99 98 - 111 mmol/L   CO2 27 22 - 32 mmol/L   Glucose, Bld 132 (H) 70 - 99 mg/dL    Comment: Glucose reference range applies only to samples taken after fasting for at least 8 hours.   BUN 20 6 - 20 mg/dL   Creatinine, Ser 0.10 (H) 0.61 - 1.24 mg/dL   Calcium 9.8 8.9 - 27.2 mg/dL   Total Protein 7.6 6.5 - 8.1 g/dL   Albumin 4.1 3.5 - 5.0 g/dL   AST 36 15 - 41 U/L   ALT 31  0 - 44 U/L   Alkaline Phosphatase 80 38 - 126 U/L   Total Bilirubin 0.9 0.3 - 1.2 mg/dL   GFR, Estimated 60 (L) >60 mL/min    Comment: (NOTE) Calculated using the CKD-EPI Creatinine Equation (2021)    Anion gap 10 5 - 15    Comment: Performed at [facility], [address] [place], [place] [address]   No results found.  Review of Systems  Musculoskeletal:  Positive for arthralgias.  All other systems reviewed and are negative.   Blood pressure (!) 159/96, pulse (!) 105, temperature 98.5 F (36.9 C), temperature source Oral, resp. rate 16, height 5\' 2"  (1.575 m), weight 69.4 kg, SpO2 98 %. Physical Exam Vitals reviewed.  HENT:     Head: Normocephalic.     Nose: Nose normal.     Mouth/Throat:     Mouth: Mucous membranes are moist.   Eyes:     Pupils: Pupils are equal, round, and reactive to light.  Cardiovascular:     Rate and Rhythm: Normal rate.     Pulses: Normal pulses.  Pulmonary:     Effort: Pulmonary effort is normal.  Abdominal:     General: Abdomen is flat.  Musculoskeletal:     Cervical back: Normal range of motion.  Skin:    General: Skin is warm.     Capillary Refill: Capillary refill takes less than 2 seconds.  Neurological:     General: No focal deficit present.     Mental Status: He is alert.  Psychiatric:        Mood and Affect: Mood normal.      Ortho exam demonstrates maintained passive range of motion with more external rotation on the right than the left to about 90 degrees.  Infraspinatus supraspinatus strength intact on the right subscap strength is weak at 3 out of 5.  No Popeye deformity present.  Deltoid is functional.  Motor or sensory function to the hand intact   Assessment/Plan Impression is right shoulder pain with retracted subscap tear with no muscle atrophy.  Hard to say if this is going to be a repairable tendon tear or not.  Plan at this time is arthroscopy with biceps tendon release and open subscap repair versus latissimus dorsi tendon transfer.  The risk and benefits of the procedure discussed with the patient including not limited to infection nerve or vessel damage shoulder stiffness incomplete restoration of strength as well as potential failure of the repair.  Patient understands the risk and benefits and wishes to proceed.  Anticipate sling immobilization for least 4 weeks after this procedure.  All questions answered.  He denies any instability in the shoulder but just reports pain and a feeling of weakness.  , MD 03/22/2022, 10:39 AM

## 2022-03-23 ENCOUNTER — Encounter (HOSPITAL_COMMUNITY): Payer: Self-pay | Admitting: Orthopedic Surgery

## 2022-03-24 ENCOUNTER — Encounter (HOSPITAL_COMMUNITY): Payer: Self-pay

## 2022-03-24 NOTE — Anesthesia Postprocedure Evaluation (Signed)
Anesthesia Post Note  Patient: Thomas Moss  Procedure(s) Performed: right shoulder arthroscopy, open subscapularis repair with tendon transfer, biceps tenodesis (Right: Shoulder)     Patient location during evaluation: PACU Anesthesia Type: General and Regional Level of consciousness: awake and alert Pain management: pain level controlled Vital Signs Assessment: post-procedure vital signs reviewed and stable Respiratory status: spontaneous breathing, nonlabored ventilation, respiratory function stable and patient connected to nasal cannula oxygen Cardiovascular status: blood pressure returned to baseline and stable Postop Assessment: no apparent nausea or vomiting Anesthetic complications: no   No notable events documented.  Last Vitals:  Vitals:   03/22/22 1630 03/22/22 1645  BP: 114/80 110/71  Pulse: 100 80  Resp: 14 11  Temp:  36.9 C  SpO2: 95% 93%    Last Pain:  Vitals:   03/22/22 1645  TempSrc:   PainSc: Asleep                 Earl Lites P Rocko Fesperman

## 2022-03-26 DIAGNOSIS — M75101 Unspecified rotator cuff tear or rupture of right shoulder, not specified as traumatic: Secondary | ICD-10-CM

## 2022-03-26 DIAGNOSIS — S43431A Superior glenoid labrum lesion of right shoulder, initial encounter: Secondary | ICD-10-CM

## 2022-03-30 ENCOUNTER — Ambulatory Visit (INDEPENDENT_AMBULATORY_CARE_PROVIDER_SITE_OTHER): Payer: Commercial Managed Care - HMO | Admitting: Surgical

## 2022-03-30 DIAGNOSIS — M75121 Complete rotator cuff tear or rupture of right shoulder, not specified as traumatic: Secondary | ICD-10-CM

## 2022-03-30 MED ORDER — OXYCODONE-ACETAMINOPHEN 5-325 MG PO TABS: 1.0000 | ORAL_TABLET | Freq: Four times a day (QID) | ORAL | 0 refills | Status: DC | PRN

## 2022-04-02 ENCOUNTER — Emergency Department (HOSPITAL_COMMUNITY)
Admission: EM | Admit: 2022-04-02 | Discharge: 2022-04-03 | Disposition: A | Discharge status: Home / Self Care | Payer: Commercial Managed Care - HMO | Attending: Emergency Medicine | Admitting: Emergency Medicine

## 2022-04-02 DIAGNOSIS — Z23 Encounter for immunization: Secondary | ICD-10-CM | POA: Diagnosis not present

## 2022-04-02 DIAGNOSIS — W3400XA Accidental discharge from unspecified firearms or gun, initial encounter: Secondary | ICD-10-CM | POA: Insufficient documentation

## 2022-04-02 DIAGNOSIS — I10 Essential (primary) hypertension: Secondary | ICD-10-CM | POA: Diagnosis not present

## 2022-04-02 DIAGNOSIS — Z79899 Other long term (current) drug therapy: Secondary | ICD-10-CM | POA: Diagnosis not present

## 2022-04-02 DIAGNOSIS — S81801A Unspecified open wound, right lower leg, initial encounter: Secondary | ICD-10-CM | POA: Diagnosis not present

## 2022-04-02 DIAGNOSIS — M79661 Pain in right lower leg: Secondary | ICD-10-CM | POA: Diagnosis present

## 2022-04-02 DIAGNOSIS — E876 Hypokalemia: Secondary | ICD-10-CM | POA: Insufficient documentation
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
## 2022-04-03 ENCOUNTER — Encounter: Payer: Self-pay | Admitting: Orthopedic Surgery

## 2022-04-03 ENCOUNTER — Encounter (HOSPITAL_COMMUNITY): Payer: Self-pay | Admitting: *Deleted

## 2022-04-03 ENCOUNTER — Emergency Department (HOSPITAL_COMMUNITY): Payer: Commercial Managed Care - HMO

## 2022-04-03 ENCOUNTER — Other Ambulatory Visit: Payer: Self-pay

## 2022-04-03 DIAGNOSIS — S86391A Other injury of muscle(s) and tendon(s) of peroneal muscle group at lower leg level, right leg, initial encounter: Secondary | ICD-10-CM | POA: Diagnosis not present

## 2022-04-03 DIAGNOSIS — W3400XA Accidental discharge from unspecified firearms or gun, initial encounter: Secondary | ICD-10-CM | POA: Diagnosis not present

## 2022-04-03 LAB — CBC WITH DIFFERENTIAL/PLATELET
Abs Immature Granulocytes: 0.06 10*3/uL (ref 0.00–0.07)
Basophils Absolute: 0.1 10*3/uL (ref 0.0–0.1)
Basophils Relative: 1 %
Eosinophils Absolute: 0.3 10*3/uL (ref 0.0–0.5)
Eosinophils Relative: 3 %
HCT: 42 % (ref 39.0–52.0)
Hemoglobin: 14.3 g/dL (ref 13.0–17.0)
Immature Granulocytes: 1 %
Lymphocytes Relative: 39 %
Lymphs Abs: 4.1 10*3/uL — ABNORMAL HIGH (ref 0.7–4.0)
MCH: 28 pg (ref 26.0–34.0)
MCHC: 34 g/dL (ref 30.0–36.0)
MCV: 82.2 fL (ref 80.0–100.0)
Monocytes Absolute: 1 10*3/uL (ref 0.1–1.0)
Monocytes Relative: 10 %
Neutro Abs: 5 10*3/uL (ref 1.7–7.7)
Neutrophils Relative %: 46 %
Platelets: 311 10*3/uL (ref 150–400)
RBC: 5.11 MIL/uL (ref 4.22–5.81)
RDW: 12.2 % (ref 11.5–15.5)
WBC: 10.5 10*3/uL (ref 4.0–10.5)
nRBC: 0 % (ref 0.0–0.2)

## 2022-04-03 LAB — BASIC METABOLIC PANEL
Anion gap: 12 (ref 5–15)
BUN: 14 mg/dL (ref 6–20)
CO2: 22 mmol/L (ref 22–32)
Calcium: 8.9 mg/dL (ref 8.9–10.3)
Chloride: 102 mmol/L (ref 98–111)
Creatinine, Ser: 1.64 mg/dL — ABNORMAL HIGH (ref 0.61–1.24)
GFR, Estimated: 56 mL/min — ABNORMAL LOW (ref >60)
Glucose, Bld: 114 mg/dL — ABNORMAL HIGH (ref 70–99)
Potassium: 3.3 mmol/L — ABNORMAL LOW (ref 3.5–5.1)
Sodium: 136 mmol/L (ref 135–145)

## 2022-04-03 LAB — I-STAT CHEM 8, ED
BUN: 17 mg/dL (ref 6–20)
Calcium, Ion: 1.15 mmol/L (ref 1.15–1.40)
Chloride: 101 mmol/L (ref 98–111)
Creatinine, Ser: 1.8 mg/dL — ABNORMAL HIGH (ref 0.61–1.24)
Glucose, Bld: 110 mg/dL — ABNORMAL HIGH (ref 70–99)
HCT: 43 % (ref 39.0–52.0)
Hemoglobin: 14.6 g/dL (ref 13.0–17.0)
Potassium: 3.4 mmol/L — ABNORMAL LOW (ref 3.5–5.1)
Sodium: 137 mmol/L (ref 135–145)
TCO2: 26 mmol/L (ref 22–32)

## 2022-04-03 MED ORDER — METHOCARBAMOL 500 MG PO TABS: 500.0000 mg | ORAL_TABLET | Freq: Three times a day (TID) | ORAL | 1 refills | Status: AC | PRN

## 2022-04-03 MED ORDER — IOHEXOL 350 MG/ML SOLN
125.0000 mL | Freq: Once | INTRAVENOUS | Status: AC | PRN
  Administered 2022-04-03: 125 mL via INTRAVENOUS

## 2022-04-03 MED ORDER — OXYCODONE-ACETAMINOPHEN 5-325 MG PO TABS: 1.0000 | ORAL_TABLET | Freq: Four times a day (QID) | ORAL | 0 refills | Status: DC | PRN

## 2022-04-03 MED ORDER — TETANUS-DIPHTH-ACELL PERTUSSIS 5-2.5-18.5 LF-MCG/0.5 IM SUSY
0.5000 mL | PREFILLED_SYRINGE | Freq: Once | INTRAMUSCULAR | Status: AC
  Administered 2022-04-03: 0.5 mL via INTRAMUSCULAR
  Filled 2022-04-03: qty 0.5

## 2022-04-03 MED ORDER — ONDANSETRON HCL 4 MG/2ML IJ SOLN
4.0000 mg | Freq: Once | INTRAMUSCULAR | Status: AC
  Administered 2022-04-03: 4 mg via INTRAVENOUS
  Filled 2022-04-03: qty 2

## 2022-04-03 MED ORDER — HYDROMORPHONE HCL 1 MG/ML IJ SOLN
0.5000 mg | Freq: Once | INTRAMUSCULAR | Status: AC
  Administered 2022-04-03: 0.5 mg via INTRAVENOUS
  Filled 2022-04-03: qty 1

## 2022-04-03 MED ORDER — HYDROMORPHONE HCL 1 MG/ML IJ SOLN
1.0000 mg | Freq: Once | INTRAMUSCULAR | Status: AC
  Administered 2022-04-03: 1 mg via INTRAVENOUS
  Filled 2022-04-03: qty 1

## 2022-04-03 MED ORDER — OXYCODONE-ACETAMINOPHEN 5-325 MG PO TABS
1.0000 | ORAL_TABLET | Freq: Once | ORAL | Status: AC
  Administered 2022-04-03: 1 via ORAL
  Filled 2022-04-03: qty 1

## 2022-04-03 NOTE — ED Notes (Signed)
Ortho tech notified for splint.  
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
## 2022-04-03 NOTE — Care Management (Addendum)
Patient just discharged consulted for wheelchair orders placed, called [employer] to ship to address ASAP. Called patient and let him know that it would be shipped as soon as possible. Called [employer] and left message for [employer] with information
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
## 2022-04-03 NOTE — Progress Notes (Signed)
Orthopedic Tech Progress Note Patient Details:  Thomas Moss 1991-01-25 [id number]  Ortho Devices Type of Ortho Device: Crutches, CAM walker Ortho Device/Splint Location: rle Ortho Device/Splint Interventions: Ordered, Application, Adjustment  I talked to the dr before applying the splint and they said we could apply a boot. Post Interventions Patient Tolerated: Well Instructions Provided: Care of device, Adjustment of device  [name] 04/03/2022, 6:20 AM
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
## 2022-04-06 ENCOUNTER — Ambulatory Visit (INDEPENDENT_AMBULATORY_CARE_PROVIDER_SITE_OTHER): Payer: Commercial Managed Care - HMO | Admitting: Orthopedic Surgery

## 2022-04-06 ENCOUNTER — Encounter (HOSPITAL_COMMUNITY): Payer: Self-pay | Admitting: Orthopedic Surgery

## 2022-04-06 DIAGNOSIS — W3400XA Accidental discharge from unspecified firearms or gun, initial encounter: Secondary | ICD-10-CM

## 2022-04-06 DIAGNOSIS — M25571 Pain in right ankle and joints of right foot: Secondary | ICD-10-CM | POA: Diagnosis not present
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
## 2022-04-09 ENCOUNTER — Encounter: Payer: Self-pay | Admitting: Orthopedic Surgery

## 2022-04-11 ENCOUNTER — Other Ambulatory Visit: Payer: Self-pay | Admitting: Urology

## 2022-04-13 ENCOUNTER — Ambulatory Visit (INDEPENDENT_AMBULATORY_CARE_PROVIDER_SITE_OTHER): Payer: Commercial Managed Care - HMO | Admitting: Orthopedic Surgery

## 2022-04-13 DIAGNOSIS — M75121 Complete rotator cuff tear or rupture of right shoulder, not specified as traumatic: Secondary | ICD-10-CM

## 2022-04-13 MED ORDER — HYDROCODONE-ACETAMINOPHEN 5-325 MG PO TABS: 1.0000 | ORAL_TABLET | Freq: Three times a day (TID) | ORAL | 0 refills | Status: DC | PRN

## 2022-04-13 MED ORDER — ASPIRIN 81 MG PO TBEC: DELAYED_RELEASE_TABLET | ORAL | 12 refills | Status: AC

## 2022-04-13 MED ORDER — METHOCARBAMOL 500 MG PO TABS: 500.0000 mg | ORAL_TABLET | Freq: Three times a day (TID) | ORAL | 0 refills | Status: AC | PRN

## 2022-04-14 ENCOUNTER — Encounter: Payer: Self-pay | Admitting: Orthopedic Surgery
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
## 2022-04-15 ENCOUNTER — Telehealth: Payer: Self-pay | Admitting: Orthopedic Surgery

## 2022-04-15 ENCOUNTER — Other Ambulatory Visit: Payer: Self-pay | Admitting: Orthopedic Surgery

## 2022-04-15 MED ORDER — OXYCODONE-ACETAMINOPHEN 5-325 MG PO TABS: 1.0000 | ORAL_TABLET | Freq: Three times a day (TID) | ORAL | 0 refills | Status: DC | PRN
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
## 2022-04-15 NOTE — Telephone Encounter (Signed)
I called and sw pt to advise rx has been sent to pharm.  

## 2022-04-15 NOTE — Telephone Encounter (Signed)
Pt called requesting a call back. Pt states the hydrocodone is making him sick. Pt is asking for a different med. Please call pt about this matter at [phone].

## 2022-04-21 ENCOUNTER — Other Ambulatory Visit: Payer: Self-pay | Admitting: Orthopedic Surgery

## 2022-04-21 ENCOUNTER — Telehealth: Payer: Self-pay | Admitting: Orthopedic Surgery

## 2022-04-21 MED ORDER — OXYCODONE-ACETAMINOPHEN 5-325 MG PO TABS: 1.0000 | ORAL_TABLET | Freq: Three times a day (TID) | ORAL | 0 refills | Status: DC | PRN

## 2022-04-21 NOTE — Telephone Encounter (Signed)
Pt called requesting refill of oxycodone. Please send to pharmacy on file. Pt states he still having pain and still bleeding. Please call pt about this matter. Pt phone number is [phone].

## 2022-04-22 NOTE — Telephone Encounter (Signed)
This was refilled yesterday.

## 2022-04-27 ENCOUNTER — Telehealth: Payer: Self-pay | Admitting: Orthopedic Surgery

## 2022-04-27 ENCOUNTER — Other Ambulatory Visit: Payer: Self-pay

## 2022-04-27 ENCOUNTER — Encounter: Payer: Self-pay | Admitting: Physical Therapy

## 2022-04-27 ENCOUNTER — Ambulatory Visit (INDEPENDENT_AMBULATORY_CARE_PROVIDER_SITE_OTHER): Payer: Commercial Managed Care - HMO | Admitting: Physical Therapy

## 2022-04-27 DIAGNOSIS — R600 Localized edema: Secondary | ICD-10-CM | POA: Diagnosis not present

## 2022-04-27 DIAGNOSIS — M25611 Stiffness of right shoulder, not elsewhere classified: Secondary | ICD-10-CM | POA: Diagnosis not present

## 2022-04-27 DIAGNOSIS — M25511 Pain in right shoulder: Secondary | ICD-10-CM | POA: Diagnosis not present

## 2022-04-27 DIAGNOSIS — M6281 Muscle weakness (generalized): Secondary | ICD-10-CM
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
## 2022-04-27 NOTE — Telephone Encounter (Signed)
Patient was here. He would like a refill on oxycodone. His call back number is [phone]

## 2022-04-28 ENCOUNTER — Other Ambulatory Visit: Payer: Self-pay | Admitting: Surgical

## 2022-04-28 MED ORDER — OXYCODONE-ACETAMINOPHEN 5-325 MG PO TABS: 1.0000 | ORAL_TABLET | Freq: Three times a day (TID) | ORAL | 0 refills | Status: DC | PRN

## 2022-04-28 NOTE — Telephone Encounter (Signed)
This was sent in and [place] message sent

## 2022-04-28 NOTE — Telephone Encounter (Signed)
From: Santo Held To: Office of [place], [place] Sent: 04/27/2022 4:43 PM EST Subject: Medication Renewal Request  Refills have been requested for the following medications:   oxyCODONE-acetaminophen (PERCOCET) 5-325 MG tablet [[facility]]  Patient Comment: Pain is 7/10  Preferred pharmacy: [id number] - [place],  - [address] Delivery method: [employer]

## 2022-05-04 ENCOUNTER — Encounter: Payer: Commercial Managed Care - HMO | Admitting: Physical Therapy

## 2022-05-04 ENCOUNTER — Ambulatory Visit: Payer: Commercial Managed Care - HMO | Admitting: Family Medicine

## 2022-05-06 ENCOUNTER — Telehealth: Payer: Self-pay | Admitting: Orthopedic Surgery

## 2022-05-06 ENCOUNTER — Other Ambulatory Visit (HOSPITAL_COMMUNITY): Payer: Self-pay | Admitting: Orthopedic Surgery

## 2022-05-06 MED ORDER — OXYCODONE-ACETAMINOPHEN 5-325 MG PO TABS: 1.0000 | ORAL_TABLET | Freq: Three times a day (TID) | ORAL | 0 refills | Status: DC | PRN

## 2022-05-06 NOTE — Telephone Encounter (Signed)
IC advised.  

## 2022-05-06 NOTE — Telephone Encounter (Signed)
Sent thx

## 2022-05-06 NOTE — Telephone Encounter (Signed)
Patient called needing Rx refilled Oxycodone. Patient wanted Dr, Marlou Sa to know he is healing well and thank you for being patient. The number to contact patient is [phone]

## 2022-05-09 ENCOUNTER — Encounter: Payer: Commercial Managed Care - HMO | Admitting: Physical Therapy

## 2022-05-09 ENCOUNTER — Telehealth: Payer: Self-pay | Admitting: Physical Therapy

## 2022-05-09 NOTE — Telephone Encounter (Signed)
No-show for PT appointment today. Called and spoke to pt, reminded him of time/date of next PT session on Wednesday.  Deniece Ree PT DPT PN2

## 2022-05-11 ENCOUNTER — Encounter: Payer: Commercial Managed Care - HMO | Admitting: Orthopedic Surgery

## 2022-05-11 ENCOUNTER — Encounter: Payer: Commercial Managed Care - HMO | Admitting: Physical Therapy

## 2022-05-11 ENCOUNTER — Telehealth: Payer: Self-pay | Admitting: Orthopedic Surgery

## 2022-05-11 NOTE — Telephone Encounter (Signed)
Patient would like to be rescheduled for next week he could not make appt today maybe later in the afternoon on a Wednesday patient is POST OP/R SHOULDER SCOPE (surgery date 03-22-22)

## 2022-05-13 ENCOUNTER — Other Ambulatory Visit (HOSPITAL_COMMUNITY): Payer: Commercial Managed Care - HMO

## 2022-05-17 ENCOUNTER — Other Ambulatory Visit (HOSPITAL_COMMUNITY): Payer: Self-pay

## 2022-05-17 ENCOUNTER — Telehealth: Payer: Self-pay | Admitting: Orthopedic Surgery

## 2022-05-17 ENCOUNTER — Other Ambulatory Visit (INDEPENDENT_AMBULATORY_CARE_PROVIDER_SITE_OTHER): Payer: Self-pay | Admitting: Orthopedic Surgery

## 2022-05-17 NOTE — Telephone Encounter (Signed)
Patient states he need Oxycodone 5 mg. Patient states he is still having numbness and sharp pains in his injured leg. Please advise..[phone]. FYI patient states he doesn't have a PCP anymore and he wants Dr. Marlou Sa to refill all his medications.

## 2022-05-18 ENCOUNTER — Telehealth: Payer: Self-pay | Admitting: Orthopedic Surgery

## 2022-05-18 ENCOUNTER — Other Ambulatory Visit: Payer: Self-pay | Admitting: Surgical

## 2022-05-18 MED ORDER — OXYCODONE-ACETAMINOPHEN 5-325 MG PO TABS: 1.0000 | ORAL_TABLET | Freq: Two times a day (BID) | ORAL | 0 refills | Status: DC | PRN

## 2022-05-18 NOTE — Telephone Encounter (Signed)
Pt called requesting a refill of oxycodone. Please send to pharmacy on file. Pt states he is in pain. Pt phone number is [phone]. Pt asked for this message to be urgent

## 2022-05-18 NOTE — Telephone Encounter (Signed)
Sent in

## 2022-05-19 ENCOUNTER — Encounter: Payer: Commercial Managed Care - HMO | Admitting: Physical Therapy

## 2022-05-19 ENCOUNTER — Telehealth: Payer: Self-pay | Admitting: Surgical

## 2022-05-19 ENCOUNTER — Telehealth: Payer: Self-pay | Admitting: Physical Therapy

## 2022-05-19 NOTE — Telephone Encounter (Signed)
FYI  I called patient and advised. He asked about his insurance and if they had authorized medication. I explained that we had not received anything from his insurance yet requesting prior auth as medication was sent in after 5pm yesterday. Patient states that he thinks he will go ahead and pay out of pocket for it since he is hurting so badly.

## 2022-05-19 NOTE — Telephone Encounter (Signed)
Thanks for update! We will work on that tomorrow for sure

## 2022-05-19 NOTE — Telephone Encounter (Signed)
This was sent in on 01/24 by Lurena Joiner
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
## 2022-05-19 NOTE — Telephone Encounter (Addendum)
Patient called advised he is waiting for a prior auth before the pharmacy will fill the Rx (Oxycodone).  Patient  spoke with the pharmacy and was told that they are sending the request again. I spoke with Tammy (HIM) and was told the request was received.  I spoke with patient and advised the request was received and will be given to Lauren so that the  Oxycodone to be approved and the pharmacy can fill his Rx.   The number to contact patient is [phone] Note: Patient has [employer] which we are OON with. I asked patient if he is going to see about getting another plan so that he will not have to pay OOP for his visits.   Pt phone number [phone]

## 2022-05-19 NOTE — Telephone Encounter (Signed)
Spoke to pt as he missed his PT appt today.  He said his insurance is no longer in network so he is unable to afford to come to PT here.  Encouraged him to call his insurance company to locate an in network PT clinic, and request a new referral.  Will d/c PT services here.  Laureen Abrahams, PT, DPT 05/19/22 3:36 PM

## 2022-05-20 ENCOUNTER — Encounter: Payer: Self-pay | Admitting: Surgical

## 2022-05-20 ENCOUNTER — Ambulatory Visit (INDEPENDENT_AMBULATORY_CARE_PROVIDER_SITE_OTHER): Payer: Self-pay | Admitting: Surgical

## 2022-05-20 DIAGNOSIS — M75121 Complete rotator cuff tear or rupture of right shoulder, not specified as traumatic: Secondary | ICD-10-CM

## 2022-05-20 NOTE — Telephone Encounter (Signed)
IC to initiate PA but pharmacy advised patient picked up this rx yesterday

## 2022-05-22 ENCOUNTER — Encounter: Payer: Self-pay | Admitting: Surgical

## 2022-05-22 NOTE — Progress Notes (Signed)
Post-Op Visit Note   Patient: Thomas Moss           Date of Birth: 10-Jan-1991           MRN: [id number] Visit Date: 05/20/2022 PCP: Marliss Coots, NP   Assessment & Plan:  Chief Complaint:  Chief Complaint  Patient presents with   Right Shoulder - Routine Post Op   Visit Diagnoses:  1. Complete tear of right rotator cuff, unspecified whether traumatic     Plan: Patient is a 32 year old male who presents s/p right shoulder tendon transfer of latissimus dorsi tendon for chronic subscapularis deficiency.  He is about 8 weeks out from procedure.  He feels he is doing well with no significant pain.  He has not been doing any heavy lifting.  He is doing his therapy upstairs but his insurance recently changed so he will need to switch to a different therapy office that takes his insurance.  He states he has had some slow improvements but has not plateaued yet in regards to his improvement with therapy.  No mechanical symptoms in the shoulder.  No fevers or chills or drainage from the incision.  On exam, patient has 30 degrees external rotation, 80 degrees abduction, 110 degrees forward elevation.  Incision is well-healed with a little bit of keloid formation.  He has axillary nerve intact with deltoid firing.  Intact sensation over the lateral deltoid.  2+ radial pulse of the operative extremity.  Intact EPL, FPL, grip strength testing, pronation/supination, bicep, tricep.  He has 5 -/5 internal rotation Drakes of the shoulder at 45 degrees of abduction.  He has 5/5 supraspinatus and infraspinatus strength.  Does not really have any significant pain with strength testing.  Plan is to continue with physical therapy.  He has changed insurances and we will work to find a therapy office that is taken by his insurance and then call him.  Prescription for physical therapy given to him today along with a copy of his operative note; therapy prescription detailed that he is not to do any  significant heavy lifting but he is okay to start some light rotator cuff strengthening exercises and some light external rotation passive range of motion.  No lifting more than 5 pounds with the operative arm indefinitely avoid lifting away from his body or lifting overhead.  Patient agreed with plan and he will be careful.  Follow-up in 3 to 4 weeks for clinical recheck with Dr. Marlou Sa so that he can have 1 visit before the postop global period is expired with his insurance situation.  Follow-Up Instructions: No follow-ups on file.   Orders:  No orders of the defined types were placed in this encounter.  No orders of the defined types were placed in this encounter.   Imaging: No results found.  PMFS History: Patient Active Problem List   Diagnosis Date Noted   Tear of right supraspinatus tendon 03/26/2022   Degenerative superior labral anterior-to-posterior (SLAP) tear of right shoulder 03/26/2022   Full thickness tear of right subscapularis tendon 07/07/2017   Biceps tendonitis on right 09/07/2015   Tendinopathy of right biceps tendon 10/16/2013   Past Medical History:  Diagnosis Date   GERD (gastroesophageal reflux disease)    Hypertension     Family History  Adopted: Yes  Family history unknown: Yes    Past Surgical History:  Procedure Laterality Date   OTHER SURGICAL HISTORY     pt reports he had heart surgery as a baby  for "two valves"   SHOULDER ARTHROSCOPY WITH OPEN ROTATOR CUFF REPAIR AND DISTAL CLAVICLE ACROMINECTOMY Right 03/22/2022   Procedure: right shoulder arthroscopy, open subscapularis repair with tendon transfer, biceps tenodesis;  Surgeon: Meredith Pel, MD;  Location: [facility];  Service: Orthopedics;  Laterality: Right;   undescended teste     Social History   Occupational History   Not on file  Tobacco Use   Smoking status: Never   Smokeless tobacco: Never  Vaping Use   Vaping Use: Never used  Substance and Sexual Activity   Alcohol use: Yes     Alcohol/week: 3.0 standard drinks of alcohol    Types: 2 Cans of beer, 1 Shots of liquor per week   Drug use: Not Currently   Sexual activity: Yes

## 2022-06-03 ENCOUNTER — Telehealth: Payer: Self-pay | Admitting: Surgical

## 2022-06-03 NOTE — Telephone Encounter (Signed)
Patient called. He would like a RX for oxycodone. His call back number is [phone]

## 2022-06-04 ENCOUNTER — Other Ambulatory Visit: Payer: Self-pay | Admitting: Surgical

## 2022-06-04 MED ORDER — HYDROCODONE-ACETAMINOPHEN 5-325 MG PO TABS: 1.0000 | ORAL_TABLET | Freq: Every day | ORAL | 0 refills | Status: AC | PRN

## 2022-06-04 NOTE — Telephone Encounter (Signed)
Zipporah Plants, any update on PT places that are good with Herschel's insurance?  He is okay for one more refill, this time for hydrocodone, and then he needs to discontinue opioid medication

## 2022-06-06 ENCOUNTER — Telehealth: Payer: Self-pay | Admitting: Orthopedic Surgery

## 2022-06-06 NOTE — Telephone Encounter (Signed)
Thomas Moss just sent meds in on 02/10--tried calling to advise-no answer.

## 2022-06-06 NOTE — Telephone Encounter (Signed)
Requesting refill on his Oxycodone please advise

## 2022-06-10 ENCOUNTER — Telehealth: Payer: Self-pay | Admitting: Surgical

## 2022-06-10 NOTE — Telephone Encounter (Signed)
Patient called advised the Hydrocodone is not agreeing with him. Patient said the medication make him feel sunny. Patient said the medication is making his head hurt. Patient asked if he can have the Oxycodone Prescribed to him again.   The number to contact patient is [phone]

## 2022-06-14 ENCOUNTER — Telehealth: Payer: Self-pay | Admitting: Surgical

## 2022-06-14 NOTE — Telephone Encounter (Signed)
Pt called again about update on pain script. Please call pt at [phone].

## 2022-06-15 ENCOUNTER — Ambulatory Visit (INDEPENDENT_AMBULATORY_CARE_PROVIDER_SITE_OTHER): Payer: Self-pay | Admitting: Orthopedic Surgery

## 2022-06-15 ENCOUNTER — Encounter: Payer: Self-pay | Admitting: Orthopedic Surgery

## 2022-06-15 DIAGNOSIS — M75121 Complete rotator cuff tear or rupture of right shoulder, not specified as traumatic: Secondary | ICD-10-CM

## 2022-06-15 MED ORDER — ACETAMINOPHEN-CODEINE 300-30 MG PO TABS: 1.0000 | ORAL_TABLET | Freq: Three times a day (TID) | ORAL | 0 refills | Status: DC | PRN

## 2022-06-15 MED ORDER — ACETAMINOPHEN-CODEINE 300-30 MG PO TABS: ORAL_TABLET | ORAL | 0 refills | Status: AC

## 2022-06-15 NOTE — Telephone Encounter (Signed)
Need to hold off on oxycodone as he is now over 2 months out.  Probably would be better to go more with something like tramadol or Tylenol with codeine.

## 2022-06-15 NOTE — Telephone Encounter (Signed)
Can discuss at scheduled appointment patient has this afternoon

## 2022-06-15 NOTE — Progress Notes (Signed)
Post-Op Visit Note   Patient: Thomas Moss           Date of Birth: 11-20-1990           MRN: [id number] Visit Date: 06/15/2022 PCP: Marliss Coots, NP   Assessment & Plan:  Chief Complaint:  Chief Complaint  Patient presents with   Right Shoulder - Routine Post Op   Visit Diagnoses:  1. 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  Overall he is doing reasonably well.  On examination he has about 50 degrees of external rotation with a firm endpoint.  This is improved from 90 degrees prior to surgery.  Subscap strength/tendon transfer strength is 3 out of 5.  External rotation strength 4+ out of 5.  Range of motion is excellent with forward flexion and abduction both above 90 degrees.  Plan at this time is to refill Tylenol 3 with no more narcotic medications after that prescription.  Physical therapy upstairs for general strengthening status post lap transfer for subscap unrepairable rotator cuff tear.  1-2 times a week for 6 weeks plus home exercise program.  I do not want any external rotation stretching for [name].  Forward flexion and abduction are okay but no external rotation stretching beyond which he has right now.  59-monthreturn for final check.  Follow-Up Instructions: No follow-ups on file.   Orders:  No orders of the defined types were placed in this encounter.  Meds ordered this encounter  Medications   DISCONTD: acetaminophen-codeine (TYLENOL #3) 300-30 MG tablet    Sig: Take 1 tablet by mouth every 8 (eight) hours as needed for moderate pain.    Dispense:  20 tablet    Refill:  0   acetaminophen-codeine (TYLENOL #3) 300-30 MG tablet    Sig: 1 po q 8 hrs prn pain    Dispense:  20 tablet    Refill:  0    Imaging: No results found.  PMFS History: Patient Active Problem List   Diagnosis Date Noted   Tear  of right supraspinatus tendon 03/26/2022   Degenerative superior labral anterior-to-posterior (SLAP) tear of right shoulder 03/26/2022   Full thickness tear of right subscapularis tendon 07/07/2017   Biceps tendonitis on right 09/07/2015   Tendinopathy of right biceps tendon 10/16/2013   Past Medical History:  Diagnosis Date   GERD (gastroesophageal reflux disease)    Hypertension     Family History  Adopted: Yes  Family history unknown: Yes    Past Surgical History:  Procedure Laterality Date   OTHER SURGICAL HISTORY     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"   SHOULDER ARTHROSCOPY WITH OPEN ROTATOR CUFF REPAIR AND DISTAL CLAVICLE ACROMINECTOMY Right 03/22/2022   Procedure: right shoulder arthroscopy, open subscapularis repair with tendon transfer, biceps tenodesis;  Surgeon: DMeredith Pel MD;  [place] [facility]  Service: Orthopedics;  Laterality: Right;   undescended teste     Social History   Occupational History   Not on file  Tobacco Use   Smoking status: Never   Smokeless tobacco: Never  Vaping Use   Vaping Use: Never used  Substance and Sexual Activity   Alcohol use: Yes    Alcohol/week: 3.0 standard drinks of alcohol    Types: 2 Cans of beer, 1 Shots of liquor per  week   Drug use: Not Currently   Sexual activity: Yes

## 2022-06-16 ENCOUNTER — Other Ambulatory Visit: Payer: Self-pay

## 2022-06-16 ENCOUNTER — Telehealth: Payer: Self-pay

## 2022-06-16 DIAGNOSIS — M75121 Complete rotator cuff tear or rupture of right shoulder, not specified as traumatic: Secondary | ICD-10-CM

## 2022-06-16 NOTE — Telephone Encounter (Signed)
I did not see orders but was able to place order.

## 2022-06-16 NOTE — Telephone Encounter (Signed)
-----   Message from Meredith Pel, MD sent at 06/15/2022  8:36 PM EST ----- Hi Reshad Saab can you see if [facility] set up for therapy.  The boxes never checked and I suspect Lovena Le did it but never  checked the box.  Thanks

## 2022-06-16 NOTE — Telephone Encounter (Signed)
-----   Message from Meredith Pel, MD sent at 06/15/2022  8:30 PM EST ----- Hi [name] can you have a more follow-up with Thomas Moss in 3 months.  Thanks

## 2022-06-16 NOTE — Telephone Encounter (Signed)
Can you please help with this ?
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
## 2022-06-20 ENCOUNTER — Ambulatory Visit: Payer: Self-pay | Admitting: Rehabilitative and Restorative Service Providers"

## 2022-06-20 ENCOUNTER — Ambulatory Visit: Payer: Self-pay | Admitting: Physical Therapy

## 2022-07-06 ENCOUNTER — Ambulatory Visit: Payer: Self-pay | Admitting: Orthopedic Surgery

## 2022-07-25 ENCOUNTER — Ambulatory Visit (HOSPITAL_COMMUNITY)
Admission: EM | Admit: 2022-07-25 | Discharge: 2022-07-25 | Disposition: A | Discharge status: Home / Self Care | Payer: Self-pay | Attending: Physician Assistant | Admitting: Physician Assistant

## 2022-07-25 ENCOUNTER — Ambulatory Visit (INDEPENDENT_AMBULATORY_CARE_PROVIDER_SITE_OTHER): Payer: Self-pay

## 2022-07-25 ENCOUNTER — Encounter (HOSPITAL_COMMUNITY): Payer: Self-pay

## 2022-07-25 DIAGNOSIS — R1031 Right lower quadrant pain: Secondary | ICD-10-CM

## 2022-07-25 DIAGNOSIS — M25561 Pain in right knee: Secondary | ICD-10-CM

## 2022-07-25 DIAGNOSIS — S8391XA Sprain of unspecified site of right knee, initial encounter: Secondary | ICD-10-CM

## 2022-07-25 DIAGNOSIS — M25461 Effusion, right knee: Secondary | ICD-10-CM

## 2022-07-25 MED ORDER — PREDNISONE 20 MG PO TABS: 40.0000 mg | ORAL_TABLET | Freq: Every day | ORAL | 0 refills | Status: AC

## 2022-07-25 NOTE — ED Triage Notes (Signed)
Pt injured right knee x 1wk causing pain and swelling. Pt has been using ice pack to being swelling down . Pt also complains of right side pain x few days

## 2022-07-25 NOTE — Discharge Instructions (Signed)
Your x-ray showed some fluid in your knee which is likely related to a sprain or ligament injury.  Since you are unable to take NSAIDs including ibuprofen we are going to use prednisone.  Do not take any NSAIDs including aspirin, ibuprofen/Advil, naproxen/Aleve with this medication.  You can use acetaminophen and Tylenol.  Use a brace for comfort and support.  Keep it elevated and avoid strenuous activity.  If your symptoms or not improving quickly please follow-up with sports medicine for further evaluation and management.  As we discussed, I believe that you are abdominal pain is more muscular or abdominal wall.  I am hopeful that the medication will help with the symptoms.  Avoid any strenuous activity including heavy lifting.  If you have any worsening symptoms including severe abdominal pain, fever, nausea, vomiting you need to go to the emergency room immediately.

## 2022-07-25 NOTE — ED Provider Notes (Signed)
[place]    CSN: [id number] Arrival date & time: 07/25/22  1645      History   Chief Complaint Chief Complaint  Patient presents with   Knee Pain    HPI Thomas Moss is a 32 y.o. male.   Patient presents today with several concerns.  His primary concern today is a 1 week history of right knee pain.  He reports that he was wrestling with a large friend when during this time he injured the knee.  He has had ongoing pain since that time.  He reports pain is worse with ascending stairs or certain movements.  He denies any popping, clicking, instability.  It is worse after he is rested for several minutes and then improves with stretching ambulation.  He has not tried any over-the-counter medication for symptom management.  Denies previous injury or surgery involving his knee.  Denies any weakness or numbness in his foot.  In addition, he reports a 3 to 4-day history of intermittent right abdominal pain.  He denies any associated fever, nausea, vomiting, changes in his bowel habits, melena, hematochezia.  He denies previous abdominal surgery and still has gallbladder and appendix.  He reports that symptoms are worse with certain stretching or rotation of his trunk.  He denies any known injury and does not believe that he hurt himself when wrestling with his friend but is unsure.  He is not taking any medication for symptom management.    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 Authorizing Provider  hydrochlorothiazide (HYDRODIURIL) 25 MG tablet Take 1 tablet (25 mg total) by mouth daily. 03/21/22  Yes Barrett Henle, MD  HYDROcodone-acetaminophen (NORCO/VICODIN) 5-325 MG tablet Take 1 tablet by mouth daily as needed for moderate pain. 06/04/22  Yes Magnant, Charles L, PA-C  losartan (COZAAR) 100 MG tablet Take 1 tablet (100 mg total) by mouth daily. 03/21/22  Yes Banister, Gwenlyn Perking, MD  methocarbamol (ROBAXIN) 500 MG tablet Take 1 tablet (500 mg total) by mouth every 8 (eight) hours as needed for muscle spasms. 04/03/22  Yes Montine Circle, PA-C  methocarbamol (ROBAXIN) 500 MG tablet Take 1 tablet (500 mg total) by mouth every 8 (eight) hours as needed for muscle spasms. 04/13/22  Yes Meredith Pel, MD  Multiple Vitamins-Minerals (MULTIVITAMIN WITH MINERALS) tablet Take 1 tablet by mouth daily. Gummy   Yes [provider]  predniSONE (DELTASONE) 20 MG tablet Take 2 tablets (40 mg total) by mouth daily for  5 days. 07/25/22 07/30/22 Yes Lesia Monica, Derry Skill, PA-C  acetaminophen-codeine (TYLENOL #3) 300-30 MG tablet 1 po q 8 hrs prn pain 06/15/22   Meredith Pel, MD  aspirin EC 81 MG tablet 1 po q d x 6 weeks 04/13/22   Meredith Pel, MD  cetirizine (ZYRTEC ALLERGY) 10 MG tablet Take 1 tablet (10 mg total) by mouth daily. Patient taking differently: Take 10 mg by mouth daily as needed for allergies. 11/29/21   Annye Forrey, Derry Skill, PA-C  gabapentin (NEURONTIN) 300 MG capsule  Take 1 capsule (300 mg total) by mouth 3 (three) times daily. 03/22/22 03/22/23  Magnant, Gerrianne Scale, PA-C    Family History Family History  Adopted: Yes  Family history unknown: Yes    Social History Social History   Tobacco Use   Smoking status: Never   Smokeless tobacco: Never  Vaping Use   Vaping Use: Never used  Substance Use Topics   Alcohol use: Yes    Alcohol/week: 3.0 standard drinks of alcohol    Types: 2 Cans of beer, 1 Shots of liquor per week   Drug use: Not Currently     Allergies   Motrin [ibuprofen]   Review of Systems Review of Systems  Constitutional:  Positive for activity change. Negative for appetite change, fatigue and fever.  Gastrointestinal:  Positive for abdominal pain. Negative for constipation, diarrhea, nausea and vomiting.  Musculoskeletal:  Positive for arthralgias, gait problem and joint swelling. Negative for myalgias.  Skin:  Negative for color change and wound.  Neurological:  Negative for weakness and numbness.     Physical Exam Triage Vital Signs ED Triage Vitals  Enc Vitals Group     BP 07/25/22 1743 (!) 156/116     Pulse Rate 07/25/22 1743 86     Resp 07/25/22 1743 13     Temp 07/25/22 1743 98.7 F (37.1 C)     Temp Source 07/25/22 1743 Oral     SpO2 07/25/22 1743 98 %     Weight --      Height --      Head Circumference --      Peak Flow --      Pain Score 07/25/22 1738 6     Pain Loc --      Pain Edu? --      Excl. in [place]? --    No data found.  Updated Vital Signs BP (!) 156/116 (BP Location: Left Arm)   Pulse 86   Temp 98.7 F (37.1 C) (Oral)   Resp 13   SpO2 98%   Visual Acuity Right Eye Distance:   Left Eye Distance:   Bilateral Distance:    Right Eye Near:   Left Eye Near:    Bilateral Near:     Physical Exam Vitals reviewed.  Constitutional:      General: He is awake.     Appearance: Normal appearance. He is well-developed. He is not ill-appearing.     Comments: Very pleasant male appears  stated age in no acute distress sitting comfortably in exam room  HENT:     Head: Normocephalic and atraumatic.     Mouth/Throat:     Pharynx: Uvula midline. No oropharyngeal exudate or posterior oropharyngeal erythema.  Cardiovascular:     Rate and Rhythm: Normal rate and regular rhythm.     Heart sounds: Normal heart sounds, S1 normal and S2 normal. No murmur heard. Pulmonary:     Effort: Pulmonary effort is normal.  Breath sounds: Normal breath sounds. No stridor. No wheezing, rhonchi or rales.     Comments: Clear to auscultation bilaterally Abdominal:     General: Bowel sounds are normal.     Palpations: Abdomen is soft.     Tenderness: There is abdominal tenderness in the right lower quadrant. There is no right CVA tenderness, left CVA tenderness, guarding or rebound. Negative signs include Rovsing's sign, McBurney's sign and psoas sign.     Comments: Mild tenderness to palpation over right lower quadrant.  No evidence of acute abdomen.  Negative McBurney point tenderness, Rovsing sign, iliopsoas sign.  Musculoskeletal:     Right knee: No swelling. Decreased range of motion. Tenderness present over the medial joint line. No LCL laxity, MCL laxity, ACL laxity or PCL laxity.     Instability Tests: Anterior drawer test negative. Posterior drawer test negative. Medial McMurray test negative and lateral McMurray test negative.     Comments: Right knee: Mild tenderness palpation over medial joint line.  No deformity noted.  Decreased range of motion with flexion secondary to pain.  No ligamentous laxity on exam.  Foot neurovascularly intact.  Neurological:     Mental Status: He is alert.  Psychiatric:        Behavior: Behavior is cooperative.      UC Treatments / Results  Labs (all labs ordered are listed, but only abnormal results are displayed) Labs Reviewed - No data to display  EKG   Radiology DG Knee Complete 4 Views Right  Result Date: 07/25/2022 CLINICAL DATA:  Pain  and swelling after injury from a week ago EXAM: RIGHT KNEE - COMPLETE 4 VIEW COMPARISON:  X-ray tib fib 04/03/2022 FINDINGS: No fracture or dislocation. Preserved joint spaces and bone mineralization. Trace joint effusion. IMPRESSION: Trace joint effusion. Electronically Signed   By: Jill Side M.D.   On: 07/25/2022 18:20    Procedures Procedures (including critical care time)  Medications Ordered in UC Medications - No data to display  Initial Impression / Assessment and Plan / UC Course  I have reviewed the triage vital signs and the nursing notes.  Pertinent labs & imaging results that were available during my care of the patient were reviewed by me and considered in my medical decision making (see chart for details).     Patient is well-appearing, afebrile, nontoxic, nontachycardic.  Vital signs and physical exam are reassuring today with no indication for emergent evaluation or imaging.  Discussed that I suspect his abdominal discomfort is related to a muscle strain given its association with activity.  He has no evidence of acute abdomen on physical exam but we discussed that the area of pain is close to where his appendix would be and if he has any changing or worsening symptoms including worsening abdominal pain, fever, nausea, vomiting he needs to go to the emergency room immediately to which he expressed understanding.  X-ray was obtained given recent trauma which showed effusion but no acute osseous abnormality.  Suspect sprain as etiology of symptoms.  Patient was encouraged to obtain a brace over-the-counter and use this for comfort and support.  He is unable to take NSAIDs so we will use prednisone to help with inflammation.  Discussed that he should not take NSAIDs with this medication but can use Tylenol for additional pain relief.  If his symptoms are not improving quickly with conservative treatment measures including RICE protocol he should follow-up with orthopedics and was  given contact information for local provider on-call with  instruction to call to schedule an appointment as needed.  Strict return precautions given.  Work excuse note provided.  Final Clinical Impressions(s) / UC Diagnoses   Final diagnoses:  Sprain of right knee, unspecified ligament, initial encounter  Effusion of right knee  Abdominal wall pain in right lower quadrant     Discharge Instructions      Your x-ray showed some fluid in your knee which is likely related to a sprain or ligament injury.  Since you are unable to take NSAIDs including ibuprofen we are going to use prednisone.  Do not take any NSAIDs including aspirin, ibuprofen/Advil, naproxen/Aleve with this medication.  You can use acetaminophen and Tylenol.  Use a brace for comfort and support.  Keep it elevated and avoid strenuous activity.  If your symptoms or not improving quickly please follow-up with sports medicine for further evaluation and management.  As we discussed, I believe that you are abdominal pain is more muscular or abdominal wall.  I am hopeful that the medication will help with the symptoms.  Avoid any strenuous activity including heavy lifting.  If you have any worsening symptoms including severe abdominal pain, fever, nausea, vomiting you need to go to the emergency room immediately.     ED Prescriptions     Medication Sig Dispense Auth. Provider   predniSONE (DELTASONE) 20 MG tablet Take 2 tablets (40 mg total) by mouth daily for 5 days. 10 tablet Bowie Doiron, Derry Skill, PA-C      PDMP not reviewed this encounter.   Terrilee Croak, PA-C 07/25/22 1834

## 2022-08-24 ENCOUNTER — Ambulatory Visit: Payer: Self-pay | Admitting: Family Medicine

## 2022-09-14 ENCOUNTER — Ambulatory Visit: Payer: Self-pay | Admitting: Surgical

## 2022-10-14 ENCOUNTER — Ambulatory Visit: Payer: Self-pay | Admitting: Family Medicine

## 2022-11-17 ENCOUNTER — Emergency Department (HOSPITAL_COMMUNITY)
Admission: EM | Admit: 2022-11-17 | Discharge: 2022-11-17 | Discharge status: Against Medical Advice | Payer: No Typology Code available for payment source | Attending: Emergency Medicine | Admitting: Emergency Medicine

## 2022-11-17 ENCOUNTER — Other Ambulatory Visit: Payer: Self-pay

## 2022-11-17 ENCOUNTER — Encounter (HOSPITAL_COMMUNITY): Payer: Self-pay

## 2022-11-17 ENCOUNTER — Emergency Department (HOSPITAL_COMMUNITY): Payer: No Typology Code available for payment source

## 2022-11-17 DIAGNOSIS — M25561 Pain in right knee: Secondary | ICD-10-CM | POA: Insufficient documentation

## 2022-11-17 DIAGNOSIS — Z760 Encounter for issue of repeat prescription: Secondary | ICD-10-CM | POA: Insufficient documentation

## 2022-11-17 DIAGNOSIS — Z5321 Procedure and treatment not carried out due to patient leaving prior to being seen by health care provider: Secondary | ICD-10-CM | POA: Insufficient documentation

## 2022-11-17 NOTE — ED Notes (Signed)
Called pts name multiple times for triage with no response
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
## 2023-07-04 IMAGING — DX DG CHEST 2V
2 series · 2 of 2 positions shown · non-contrast
Comparison: None.

CLINICAL DATA: Nonproductive cough, congestion

EXAM:
CHEST - 2 VIEW

[chest pa]
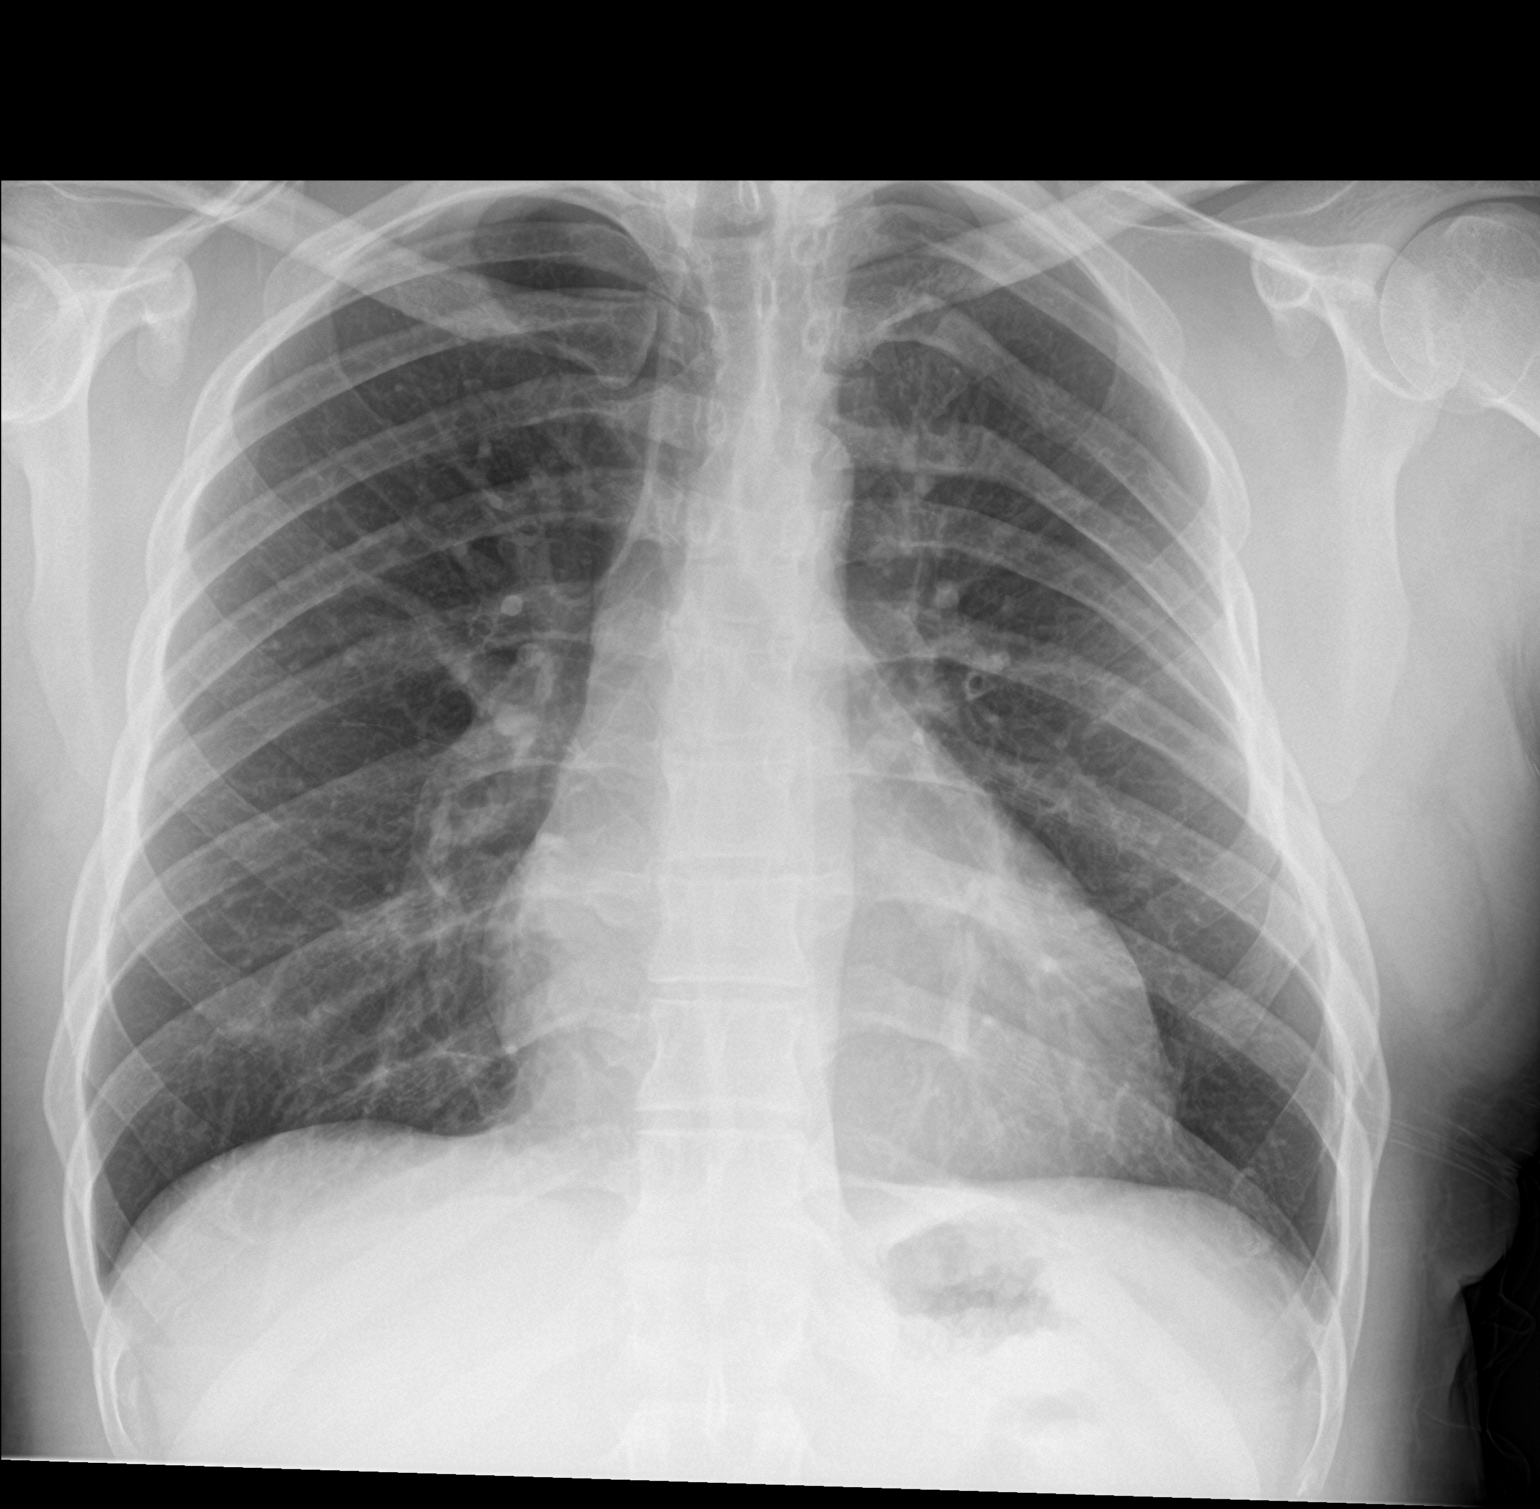

[chest lat]
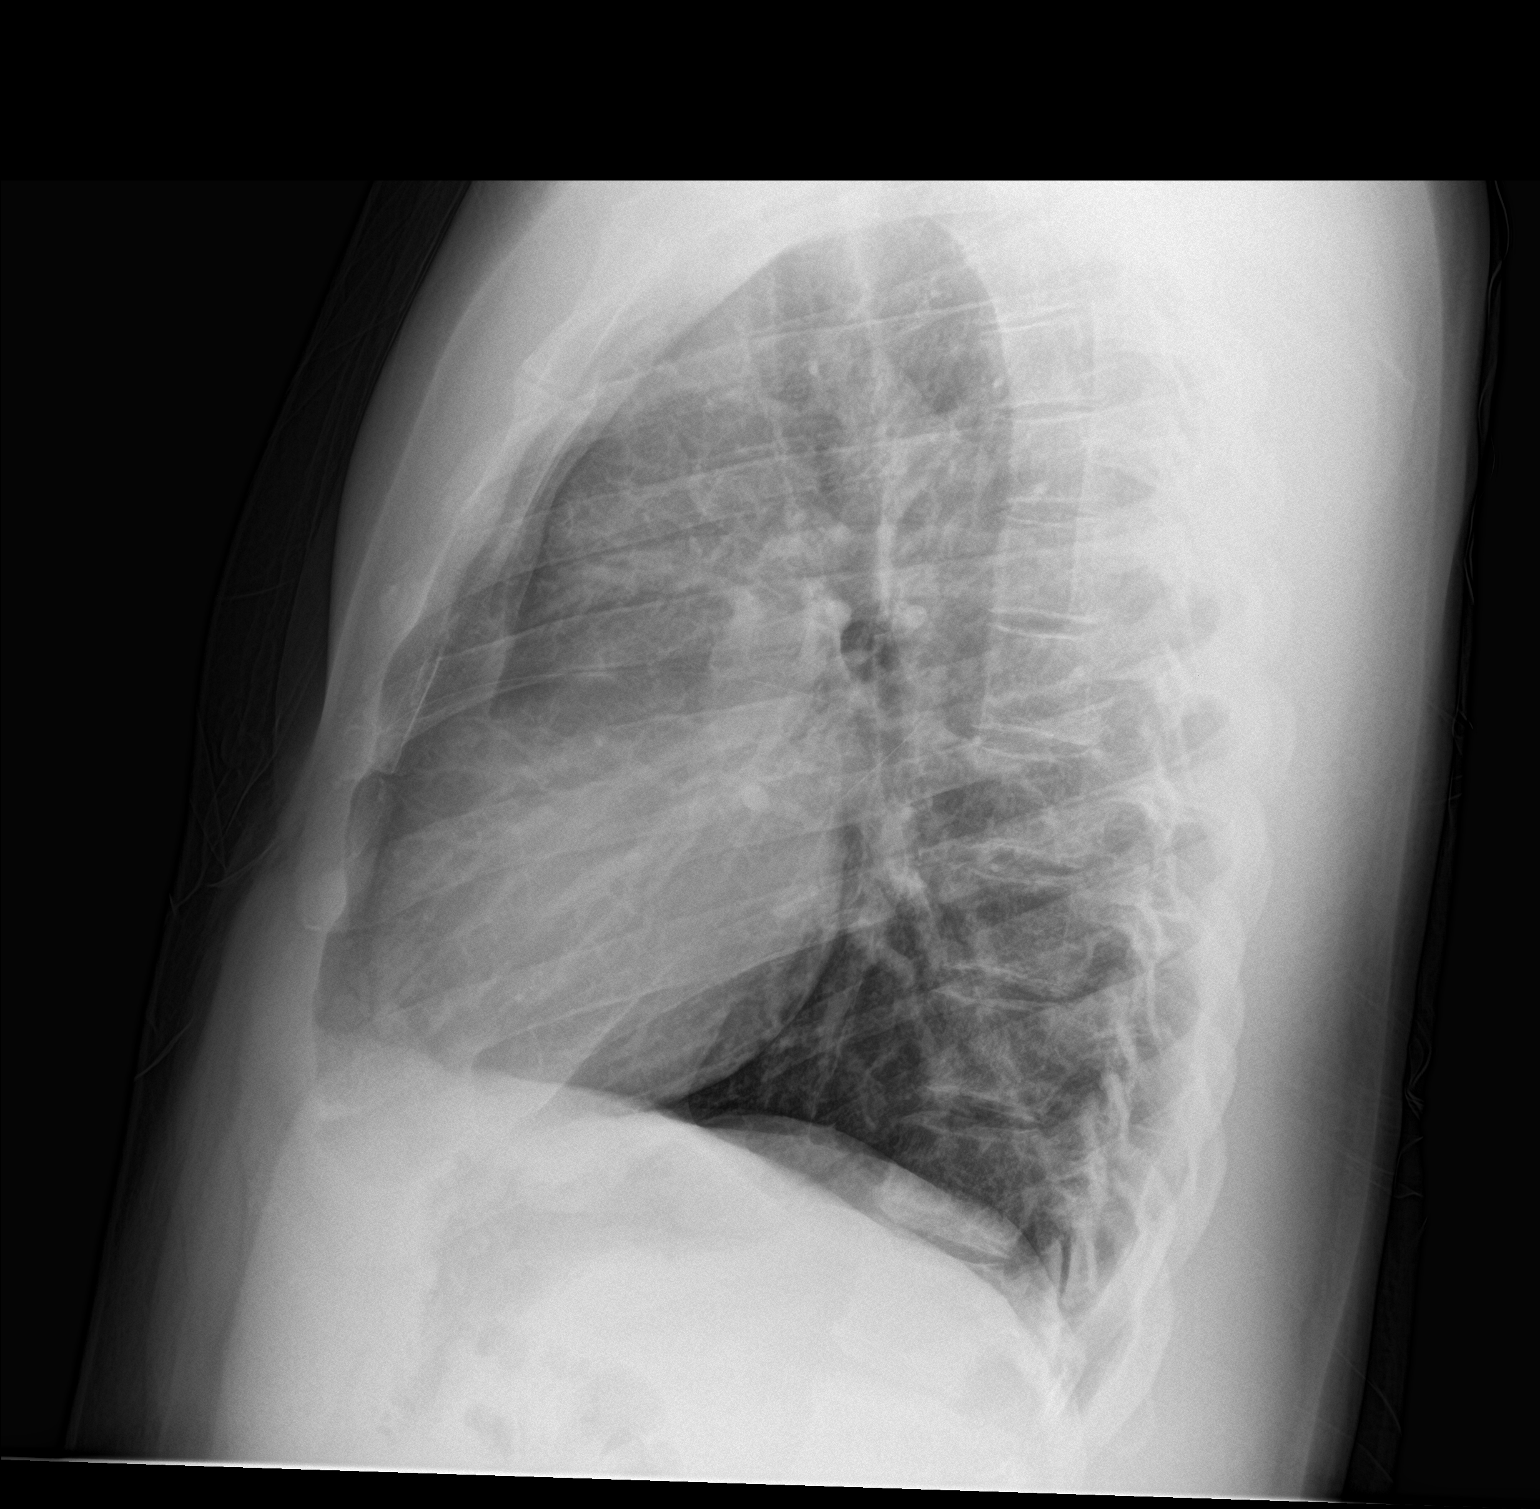

[2 of 2 positions shown; findings below may reference images not displayed]

FINDINGS: The heart size and mediastinal contours are within normal limits.
Both lungs are clear. The visualized skeletal structures are
unremarkable.
IMPRESSION: No active cardiopulmonary disease.

## 2024-05-06 ENCOUNTER — Other Ambulatory Visit: Payer: Self-pay | Admitting: *Deleted

## 2024-05-06 DIAGNOSIS — I10 Essential (primary) hypertension: Secondary | ICD-10-CM

## 2024-05-06 MED ORDER — HYDROCHLOROTHIAZIDE 25 MG PO TABS: 25.0000 mg | ORAL_TABLET | Freq: Every day | ORAL | 0 refills | Status: AC

## 2024-05-06 MED ORDER — LOSARTAN POTASSIUM 100 MG PO TABS: 100.0000 mg | ORAL_TABLET | Freq: Every day | ORAL | 0 refills | Status: AC

## 2024-05-06 NOTE — Progress Notes (Signed)
 Came by for med refills for HTN BP 130/90.  Ok for 30 day [email or web address]
